# Patient Record
Sex: Female | Born: 1975
Health system: Southern US, Community
[De-identification: ages and names within clinical notes are randomized; demographics above are authoritative.]

## PROBLEM LIST (undated history)

## (undated) DIAGNOSIS — R569 Unspecified convulsions: Secondary | ICD-10-CM

## (undated) DIAGNOSIS — F4310 Post-traumatic stress disorder, unspecified: Secondary | ICD-10-CM

## (undated) DIAGNOSIS — J45909 Unspecified asthma, uncomplicated: Secondary | ICD-10-CM

## (undated) DIAGNOSIS — J449 Chronic obstructive pulmonary disease, unspecified: Secondary | ICD-10-CM

## (undated) DIAGNOSIS — F319 Bipolar disorder, unspecified: Secondary | ICD-10-CM

## (undated) DIAGNOSIS — C801 Malignant (primary) neoplasm, unspecified: Secondary | ICD-10-CM

## (undated) HISTORY — PX: CHOLECYSTECTOMY: SHX55

## (undated) HISTORY — PX: APPENDECTOMY: SHX54

## (undated) HISTORY — PX: TUBAL LIGATION: SHX77

## (undated) HISTORY — PX: TONSILLECTOMY: SUR1361

---

## 2014-10-13 ENCOUNTER — Other Ambulatory Visit (HOSPITAL_COMMUNITY): Payer: Self-pay | Admitting: Urology

## 2014-10-13 DIAGNOSIS — R310 Gross hematuria: Secondary | ICD-10-CM

## 2014-10-16 ENCOUNTER — Ambulatory Visit (HOSPITAL_COMMUNITY)
Admission: RE | Admit: 2014-10-16 | Discharge: 2014-10-16 | Disposition: A | Discharge status: Home / Self Care | Payer: Medicaid Other | Source: Ambulatory Visit | Attending: Urology | Admitting: Urology

## 2014-10-16 ENCOUNTER — Encounter (HOSPITAL_COMMUNITY): Payer: Self-pay

## 2014-10-16 DIAGNOSIS — R300 Dysuria: Secondary | ICD-10-CM | POA: Insufficient documentation

## 2014-10-16 DIAGNOSIS — J45909 Unspecified asthma, uncomplicated: Secondary | ICD-10-CM | POA: Diagnosis not present

## 2014-10-16 DIAGNOSIS — R310 Gross hematuria: Secondary | ICD-10-CM | POA: Diagnosis present

## 2014-10-16 HISTORY — DX: Malignant (primary) neoplasm, unspecified: C80.1

## 2014-10-16 HISTORY — DX: Unspecified asthma, uncomplicated: J45.909

## 2014-10-16 MED ORDER — IOHEXOL 300 MG/ML  SOLN
150.0000 mL | Freq: Once | INTRAMUSCULAR | Status: AC | PRN
  Administered 2014-10-16: 150 mL via INTRAVENOUS

## 2014-12-03 ENCOUNTER — Emergency Department (HOSPITAL_COMMUNITY): Payer: Medicaid Other

## 2014-12-03 ENCOUNTER — Emergency Department (HOSPITAL_COMMUNITY)
Admission: EM | Admit: 2014-12-03 | Discharge: 2014-12-03 | Disposition: A | Discharge status: Home / Self Care | Payer: Medicaid Other | Attending: Emergency Medicine | Admitting: Emergency Medicine

## 2014-12-03 ENCOUNTER — Encounter (HOSPITAL_COMMUNITY): Payer: Self-pay | Admitting: *Deleted

## 2014-12-03 DIAGNOSIS — S6991XA Unspecified injury of right wrist, hand and finger(s), initial encounter: Secondary | ICD-10-CM | POA: Insufficient documentation

## 2014-12-03 DIAGNOSIS — W228XXA Striking against or struck by other objects, initial encounter: Secondary | ICD-10-CM | POA: Diagnosis not present

## 2014-12-03 DIAGNOSIS — M79643 Pain in unspecified hand: Secondary | ICD-10-CM

## 2014-12-03 DIAGNOSIS — Z720 Tobacco use: Secondary | ICD-10-CM | POA: Insufficient documentation

## 2014-12-03 DIAGNOSIS — Y9389 Activity, other specified: Secondary | ICD-10-CM | POA: Diagnosis not present

## 2014-12-03 DIAGNOSIS — M79641 Pain in right hand: Secondary | ICD-10-CM

## 2014-12-03 DIAGNOSIS — Z859 Personal history of malignant neoplasm, unspecified: Secondary | ICD-10-CM | POA: Insufficient documentation

## 2014-12-03 DIAGNOSIS — J45909 Unspecified asthma, uncomplicated: Secondary | ICD-10-CM | POA: Diagnosis not present

## 2014-12-03 DIAGNOSIS — Z880 Allergy status to penicillin: Secondary | ICD-10-CM | POA: Diagnosis not present

## 2014-12-03 DIAGNOSIS — Y9289 Other specified places as the place of occurrence of the external cause: Secondary | ICD-10-CM | POA: Diagnosis not present

## 2014-12-03 DIAGNOSIS — Y998 Other external cause status: Secondary | ICD-10-CM | POA: Diagnosis not present

## 2014-12-03 MED ORDER — NAPROXEN 500 MG PO TABS: 500.0000 mg | ORAL_TABLET | Freq: Two times a day (BID) | ORAL | Status: DC

## 2014-12-03 MED ORDER — IBUPROFEN 400 MG PO TABS
800.0000 mg | ORAL_TABLET | Freq: Once | ORAL | Status: AC
  Administered 2014-12-03: 800 mg via ORAL
  Filled 2014-12-03: qty 2
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
## 2014-12-03 NOTE — Discharge Instructions (Signed)
Take the prescribed medication as directed.  May also wish to ice and elevate hand at home to help with pain/swelling. Follow-up with Dr. Caralyn Guile if symptoms worsen or no improvement in the next few days. Return to the ED for new or worsening symptoms.

## 2014-12-03 NOTE — ED Notes (Signed)
Patient transported to X-ray 
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
## 2014-12-08 ENCOUNTER — Emergency Department (HOSPITAL_COMMUNITY)
Admission: EM | Admit: 2014-12-08 | Discharge: 2014-12-08 | Disposition: A | Discharge status: Home / Self Care | Payer: Medicaid Other | Attending: Emergency Medicine | Admitting: Emergency Medicine

## 2014-12-08 DIAGNOSIS — Z859 Personal history of malignant neoplasm, unspecified: Secondary | ICD-10-CM | POA: Diagnosis not present

## 2014-12-08 DIAGNOSIS — Z79899 Other long term (current) drug therapy: Secondary | ICD-10-CM | POA: Diagnosis not present

## 2014-12-08 DIAGNOSIS — R042 Hemoptysis: Secondary | ICD-10-CM | POA: Insufficient documentation

## 2014-12-08 DIAGNOSIS — R040 Epistaxis: Secondary | ICD-10-CM | POA: Insufficient documentation

## 2014-12-08 DIAGNOSIS — Z720 Tobacco use: Secondary | ICD-10-CM | POA: Diagnosis not present

## 2014-12-08 DIAGNOSIS — Z7951 Long term (current) use of inhaled steroids: Secondary | ICD-10-CM | POA: Diagnosis not present

## 2014-12-08 DIAGNOSIS — J45909 Unspecified asthma, uncomplicated: Secondary | ICD-10-CM | POA: Insufficient documentation

## 2014-12-08 MED ORDER — SALINE SPRAY 0.65 % NA SOLN: 1.0000 | NASAL | Status: AC | PRN

## 2014-12-08 NOTE — ED Provider Notes (Signed)
CSN: [id number]     Arrival date & time 12/08/14  1056 History   First MD Initiated Contact with Patient 12/08/14 1059     Chief Complaint  Patient presents with  . Epistaxis  . Hemoptysis     (Consider location/radiation/quality/duration/timing/severity/associated sxs/prior Treatment) HPI Comments: Patient presents to the emergency department with chief complaint of epistaxis and hemoptysis. Patient states that for the past couple of days she has had intermittent nosebleeds. She also states that yesterday after the bleeding occurred she coughed up a small amount of blood. She took pictures and this showed it to me. It was indeed very small amount of blood-tinged sputum. She states that the nosebleeds are intermittent. She denies any dramatic injury. Nothing makes his symptoms better or worse. She does not take any blood thinners. There are no other symptoms at this time.  The history is provided by the patient. No language interpreter was used.    Past Medical History  Diagnosis Date  . Asthma   . Cancer    Past Surgical History  Procedure Laterality Date  . Tonsillectomy    . Appendectomy    . Cholecystectomy    . Tubal ligation    . Cesarean section     No family history on file. History  Substance Use Topics  . Smoking status: Current Every Day Smoker -- 1.00 packs/day    Types: Cigarettes  . Smokeless tobacco: Not on file  . Alcohol Use: No   OB History    No data available     Review of Systems  Constitutional: Negative for fever and chills.  HENT: Positive for nosebleeds.   Respiratory: Negative for shortness of breath.   Cardiovascular: Negative for chest pain.  Gastrointestinal: Negative for nausea, vomiting, diarrhea and constipation.  Genitourinary: Negative for dysuria.  All other systems reviewed and are negative.     Allergies  Diphenhydramine; Penicillins; and Theophyllines  Home Medications   Prior to Admission medications   Medication Sig  Start Date End Date Taking? Authorizing Provider  acetaminophen-codeine (TYLENOL #3) 300-30 MG per tablet Take 1 tablet by mouth every 4 (four) hours as needed for moderate pain.    Historical Provider, MD  albuterol (PROVENTIL HFA;VENTOLIN HFA) 108 (90 BASE) MCG/ACT inhaler Inhale 1 puff into the lungs every 4 (four) hours as needed for wheezing or shortness of breath.    Historical Provider, MD  atorvastatin (LIPITOR) 20 MG tablet Take 20 mg by mouth daily.    Historical Provider, MD  busPIRone (BUSPAR) 7.5 MG tablet Take 7.5 mg by mouth 3 (three) times daily as needed. For anxiety    Historical Provider, MD  ergocalciferol (VITAMIN D2) 50000 UNITS capsule Take 50,000 Units by mouth once a week. Take on Thursdays    Historical Provider, MD  Fluticasone-Salmeterol (ADVAIR) 250-50 MCG/DOSE AEPB Inhale 1 puff into the lungs 2 (two) times daily.    Historical Provider, MD  folic acid (FOLVITE) 1 MG tablet Take 1 mg by mouth daily.    Historical Provider, MD  HYDROcodone-acetaminophen (NORCO/VICODIN) 5-325 MG per tablet Take 1 tablet by mouth every 6 (six) hours as needed for moderate pain.    Historical Provider, MD  IRON PO Take 1 tablet by mouth daily.    Historical Provider, MD  lamoTRIgine (LAMICTAL) 25 MG tablet Take 50 mg by mouth 2 (two) times daily.    Historical Provider, MD  loratadine (CLARITIN) 10 MG tablet Take 10 mg by mouth daily.    Historical Provider, MD  naproxen (NAPROSYN) 500 MG tablet Take 1 tablet (500 mg total) by mouth 2 (two) times daily with a meal. 12/03/14   Larene Pickett, PA-C  omeprazole (PRILOSEC) 20 MG capsule Take 20 mg by mouth daily.    Historical Provider, MD  sertraline (ZOLOFT) 50 MG tablet Take 50 mg by mouth daily.    Historical Provider, MD  varenicline (CHANTIX) 1 MG tablet Take 1 mg by mouth 2 (two) times daily.    Historical Provider, MD  vitamin B-12 (CYANOCOBALAMIN) 1000 MCG tablet Take 1,000 mcg by mouth daily.    Historical Provider, MD   BP 146/87  mmHg  Pulse 96  Temp(Src) 99.4 F (37.4 C) (Oral)  Resp 18  SpO2 100%  LMP 11/26/2014 Physical Exam  Constitutional: She is oriented to person, place, and time. She appears well-developed and well-nourished.  HENT:  Head: Normocephalic and atraumatic.  Bilateral nares are patent, and there is some friable tissue in the anterior nose at [place] plexus on the right, no masses, no hematoma, airway is patent  Oropharynx is unremarkable, and there is no evidence of bleeding  Eyes: Conjunctivae and EOM are normal. Pupils are equal, round, and reactive to light.  Neck: Normal range of motion. Neck supple.  Cardiovascular: Normal rate and regular rhythm.  Exam reveals no gallop and no friction rub.   No murmur heard. Pulmonary/Chest: Effort normal and breath sounds normal. No respiratory distress. She has no wheezes. She has no rales. She exhibits no tenderness.  Abdominal: Soft. Bowel sounds are normal. She exhibits no distension and no mass. There is no tenderness. There is no rebound and no guarding.  Musculoskeletal: Normal range of motion. She exhibits no edema or tenderness.  Neurological: She is alert and oriented to person, place, and time.  Skin: Skin is warm and dry.  Psychiatric: She has a normal mood and affect. Her behavior is normal. Judgment and thought content normal.  Nursing note and vitals reviewed.   ED Course  Procedures (including critical care time) Labs Review Labs Reviewed - No data to display  Imaging Review No results found.   EKG Interpretation None      MDM   Final diagnoses:  Epistaxis    Patient with intermittent nosebleeds. Appear to be anterior. No bleeding at this time. Insignificant hemoptysis secondary to nose bleeding. No concerning gross hemoptysis, chest pain, or shortness breath. Will recommend nasal saline sprays. Nose bleeding precautions given. Patient is well-appearing. She is not in any apparent distress. Will recommend  follow-up with primary care or ENT as needed.    Montine Circle, PA-C 12/08/14 1123  Debby Freiberg, MD 12/12/14 [phone]

## 2014-12-08 NOTE — ED Notes (Signed)
Pt reports mild hemoptysis with coughing yesterday. Nose bleeding off and on since yesterday. No bleeding noted at this time. Pt NAD.

## 2014-12-08 NOTE — Discharge Instructions (Signed)

## 2014-12-29 ENCOUNTER — Encounter (HOSPITAL_COMMUNITY): Payer: Self-pay | Admitting: Emergency Medicine

## 2014-12-29 ENCOUNTER — Emergency Department (HOSPITAL_COMMUNITY)
Admission: EM | Admit: 2014-12-29 | Discharge: 2014-12-30 | Disposition: A | Discharge status: Home / Self Care | Payer: Medicaid Other | Attending: Emergency Medicine | Admitting: Emergency Medicine

## 2014-12-29 DIAGNOSIS — R1030 Lower abdominal pain, unspecified: Secondary | ICD-10-CM | POA: Diagnosis not present

## 2014-12-29 DIAGNOSIS — Z791 Long term (current) use of non-steroidal anti-inflammatories (NSAID): Secondary | ICD-10-CM | POA: Insufficient documentation

## 2014-12-29 DIAGNOSIS — Z79899 Other long term (current) drug therapy: Secondary | ICD-10-CM | POA: Diagnosis not present

## 2014-12-29 DIAGNOSIS — Z880 Allergy status to penicillin: Secondary | ICD-10-CM | POA: Insufficient documentation

## 2014-12-29 DIAGNOSIS — Z859 Personal history of malignant neoplasm, unspecified: Secondary | ICD-10-CM | POA: Diagnosis not present

## 2014-12-29 DIAGNOSIS — Z720 Tobacco use: Secondary | ICD-10-CM | POA: Diagnosis not present

## 2014-12-29 DIAGNOSIS — R109 Unspecified abdominal pain: Secondary | ICD-10-CM

## 2014-12-29 DIAGNOSIS — Z3202 Encounter for pregnancy test, result negative: Secondary | ICD-10-CM | POA: Insufficient documentation

## 2014-12-29 DIAGNOSIS — R1111 Vomiting without nausea: Secondary | ICD-10-CM | POA: Diagnosis not present

## 2014-12-29 DIAGNOSIS — J441 Chronic obstructive pulmonary disease with (acute) exacerbation: Secondary | ICD-10-CM | POA: Insufficient documentation

## 2014-12-29 DIAGNOSIS — R197 Diarrhea, unspecified: Secondary | ICD-10-CM | POA: Diagnosis present

## 2014-12-29 LAB — URINALYSIS, ROUTINE W REFLEX MICROSCOPIC
Bilirubin Urine: NEGATIVE
GLUCOSE, UA: NEGATIVE mg/dL
HGB URINE DIPSTICK: NEGATIVE
Ketones, ur: NEGATIVE mg/dL
Leukocytes, UA: NEGATIVE
NITRITE: NEGATIVE
PH: 5 (ref 5.0–8.0)
Protein, ur: NEGATIVE mg/dL
Specific Gravity, Urine: 1.021 (ref 1.005–1.030)
Urobilinogen, UA: 0.2 mg/dL (ref 0.0–1.0)

## 2014-12-29 LAB — CBC WITH DIFFERENTIAL/PLATELET
Basophils Absolute: 0 10*3/uL (ref 0.0–0.1)
Basophils Relative: 0 % (ref 0–1)
Eosinophils Absolute: 0.2 10*3/uL (ref 0.0–0.7)
Eosinophils Relative: 1 % (ref 0–5)
HEMATOCRIT: 45.6 % (ref 36.0–46.0)
Hemoglobin: 15.7 g/dL — ABNORMAL HIGH (ref 12.0–15.0)
LYMPHS PCT: 32 % (ref 12–46)
Lymphs Abs: 4.5 10*3/uL — ABNORMAL HIGH (ref 0.7–4.0)
MCH: 30.7 pg (ref 26.0–34.0)
MCHC: 34.4 g/dL (ref 30.0–36.0)
MCV: 89.1 fL (ref 78.0–100.0)
MONO ABS: 1 10*3/uL (ref 0.1–1.0)
MONOS PCT: 7 % (ref 3–12)
NEUTROS PCT: 59 % (ref 43–77)
Neutro Abs: 8.1 10*3/uL — ABNORMAL HIGH (ref 1.7–7.7)
Platelets: 313 10*3/uL (ref 150–400)
RBC: 5.12 MIL/uL — AB (ref 3.87–5.11)
RDW: 13.9 % (ref 11.5–15.5)
WBC: 12.7 10*3/uL — ABNORMAL HIGH (ref 4.0–10.5)

## 2014-12-29 LAB — COMPREHENSIVE METABOLIC PANEL
ALT: 20 U/L (ref 14–54)
ANION GAP: 12 (ref 5–15)
AST: 19 U/L (ref 15–41)
Albumin: 4.1 g/dL (ref 3.5–5.0)
Alkaline Phosphatase: 77 U/L (ref 38–126)
BUN: 10 mg/dL (ref 6–20)
CALCIUM: 9.7 mg/dL (ref 8.9–10.3)
CO2: 19 mmol/L — AB (ref 22–32)
Chloride: 107 mmol/L (ref 101–111)
Creatinine, Ser: 0.78 mg/dL (ref 0.44–1.00)
GFR calc Af Amer: >60 mL/min (ref >60)
GFR calc non Af Amer: >60 mL/min (ref >60)
Glucose, Bld: 181 mg/dL — ABNORMAL HIGH (ref 65–99)
Potassium: 3.9 mmol/L (ref 3.5–5.1)
Sodium: 138 mmol/L (ref 135–145)
TOTAL PROTEIN: 7.2 g/dL (ref 6.5–8.1)
Total Bilirubin: 0.4 mg/dL (ref 0.3–1.2)

## 2014-12-29 LAB — POC URINE PREG, ED: PREG TEST UR: NEGATIVE

## 2014-12-29 MED ORDER — PREDNISONE 20 MG PO TABS
60.0000 mg | ORAL_TABLET | Freq: Once | ORAL | Status: AC
  Administered 2014-12-30: 60 mg via ORAL
  Filled 2014-12-29: qty 3

## 2014-12-29 MED ORDER — IOHEXOL 300 MG/ML  SOLN
25.0000 mL | Freq: Once | INTRAMUSCULAR | Status: AC | PRN
  Administered 2014-12-30: 25 mL via ORAL

## 2014-12-29 MED ORDER — MAGNESIUM SULFATE 2 GM/50ML IV SOLN
2.0000 g | Freq: Once | INTRAVENOUS | Status: AC
  Administered 2014-12-30: 2 g via INTRAVENOUS
  Filled 2014-12-29: qty 50

## 2014-12-29 MED ORDER — ALBUTEROL (5 MG/ML) CONTINUOUS INHALATION SOLN
10.0000 mg/h | INHALATION_SOLUTION | RESPIRATORY_TRACT | Status: DC
  Administered 2014-12-29: 10 mg/h via RESPIRATORY_TRACT
  Filled 2014-12-29: qty 20

## 2014-12-29 MED ORDER — SODIUM CHLORIDE 0.9 % IV BOLUS (SEPSIS)
1000.0000 mL | Freq: Once | INTRAVENOUS | Status: AC
  Administered 2014-12-30: 1000 mL via INTRAVENOUS

## 2014-12-29 MED ORDER — IPRATROPIUM BROMIDE 0.02 % IN SOLN
1.0000 mg | Freq: Once | RESPIRATORY_TRACT | Status: AC
  Administered 2014-12-29: 1 mg via RESPIRATORY_TRACT
  Filled 2014-12-29: qty 5

## 2014-12-29 NOTE — ED Notes (Signed)
Pt. woke up this evening with emesis , diarrhea , generalized weakness and SOB , pt. stated she was started a new medication this afternoon Valium vaginal suppository. Respirations unlabored/ airway intact.

## 2014-12-29 NOTE — ED Provider Notes (Signed)
CSN: [id number]     Arrival date & time 12/29/14  2103 History  This chart was scribed for Everlene Balls, MD by Chester Holstein, ED Scribe. This patient was seen in room D33C/D33C and the patient's care was started at 11:29 PM.      Chief Complaint  Patient presents with  . Emesis  . Diarrhea     The history is provided by the patient. No language interpreter was used.   HPI Comments: Janet Gomez is a 39 y.o. female with PMHx of COPD, asthma, and CA who presents to the Emergency Department complaining of vomiting and diarrhea with onset this evening. Pt used a Valium suppository at 3:30 PM and took a nap and woke up with symptoms. She also reports wheezing. Pt is s/p surgery cystoscopy and hydrodistention on 12/24/14. She notes burning pain to suprapubic area.  Pt denies fever. She states she feels better now and denies nausea.  Past Medical History  Diagnosis Date  . Asthma   . Cancer    Past Surgical History  Procedure Laterality Date  . Tonsillectomy    . Appendectomy    . Cholecystectomy    . Tubal ligation    . Cesarean section     No family history on file. History  Substance Use Topics  . Smoking status: Current Every Day Smoker -- 1.00 packs/day    Types: Cigarettes  . Smokeless tobacco: Not on file  . Alcohol Use: No   OB History    No data available     Review of Systems  Gastrointestinal: Positive for vomiting and diarrhea.  A complete 10 system review of systems was obtained and all systems are negative except as noted in the HPI and PMH.      Allergies  Levaquin; Diphenhydramine; Penicillins; and Theophyllines  Home Medications   Prior to Admission medications   Medication Sig Start Date End Date Taking? Authorizing Provider  acetaminophen-codeine (TYLENOL #3) 300-30 MG per tablet Take 1 tablet by mouth every 4 (four) hours as needed for moderate pain.   Yes Historical Provider, MD  albuterol (PROVENTIL HFA;VENTOLIN HFA) 108 (90 BASE) MCG/ACT inhaler  Inhale 1 puff into the lungs every 4 (four) hours as needed for wheezing or shortness of breath.   Yes Historical Provider, MD  atorvastatin (LIPITOR) 20 MG tablet Take 20 mg by mouth daily.   Yes Historical Provider, MD  busPIRone (BUSPAR) 7.5 MG tablet Take 7.5 mg by mouth 3 (three) times daily as needed. For anxiety   Yes Historical Provider, MD  ergocalciferol (VITAMIN D2) 50000 UNITS capsule Take 50,000 Units by mouth once a week. Take on Thursdays   Yes Historical Provider, MD  Fluticasone-Salmeterol (ADVAIR) 250-50 MCG/DOSE AEPB Inhale 1 puff into the lungs 2 (two) times daily.   Yes Historical Provider, MD  folic acid (FOLVITE) 1 MG tablet Take 1 mg by mouth daily.   Yes Historical Provider, MD  HYDROcodone-acetaminophen (NORCO/VICODIN) 5-325 MG per tablet Take 1 tablet by mouth every 6 (six) hours as needed for moderate pain.   Yes Historical Provider, MD  IRON PO Take 1 tablet by mouth daily.   Yes Historical Provider, MD  lamoTRIgine (LAMICTAL) 25 MG tablet Take 50 mg by mouth 2 (two) times daily.   Yes Historical Provider, MD  loratadine (CLARITIN) 10 MG tablet Take 10 mg by mouth daily.   Yes Historical Provider, MD  naproxen (NAPROSYN) 500 MG tablet Take 1 tablet (500 mg total) by mouth 2 (two) times daily  with a meal. 12/03/14  Yes Larene Pickett, PA-C  omeprazole (PRILOSEC) 20 MG capsule Take 20 mg by mouth daily.   Yes Historical Provider, MD  sertraline (ZOLOFT) 50 MG tablet Take 50 mg by mouth daily.   Yes Historical Provider, MD  sodium chloride (OCEAN) 0.65 % SOLN nasal spray Place 1 spray into both nostrils as needed for congestion. 12/08/14  Yes Montine Circle, PA-C  vitamin B-12 (CYANOCOBALAMIN) 1000 MCG tablet Take 1,000 mcg by mouth daily.   Yes Historical Provider, MD   BP 121/76 mmHg  Pulse 112  Temp(Src) 99.7 F (37.6 C) (Oral)  Resp 18  SpO2 96%  LMP 12/22/2014 Physical Exam  Constitutional: She is oriented to person, place, and time. She appears well-developed  and well-nourished. No distress.  HENT:  Head: Normocephalic and atraumatic.  Nose: Nose normal.  Mouth/Throat: Oropharynx is clear and moist. No oropharyngeal exudate.  Eyes: Conjunctivae and EOM are normal. Pupils are equal, round, and reactive to light. No scleral icterus.  Neck: Normal range of motion. Neck supple. No JVD present. No tracheal deviation present. No thyromegaly present.  Cardiovascular: Normal rate, regular rhythm and normal heart sounds.  Exam reveals no gallop and no friction rub.   No murmur heard. Pulmonary/Chest: Effort normal. No respiratory distress. She has wheezes (bilaterally). She exhibits no tenderness.  Abdominal: Soft. Bowel sounds are normal. She exhibits no distension and no mass. There is tenderness in the suprapubic area. There is no rebound and no guarding.  Musculoskeletal: Normal range of motion. She exhibits no edema or tenderness.  Lymphadenopathy:    She has no cervical adenopathy.  Neurological: She is alert and oriented to person, place, and time. No cranial nerve deficit. She exhibits normal muscle tone.  Skin: Skin is warm and dry. No rash noted. No erythema. No pallor.  Nursing note and vitals reviewed.   ED Course  Procedures (including critical care time) DIAGNOSTIC STUDIES: Oxygen Saturation is 96% on room air, normal by my interpretation.    COORDINATION OF CARE: 11:33 PM Discussed treatment plan with patient at beside, the patient agrees with the plan and has no further questions at this time.   Labs Review Labs Reviewed  CBC WITH DIFFERENTIAL/PLATELET - Abnormal; Notable for the following:    WBC 12.7 (*)    RBC 5.12 (*)    Hemoglobin 15.7 (*)    Neutro Abs 8.1 (*)    Lymphs Abs 4.5 (*)    All other components within normal limits  COMPREHENSIVE METABOLIC PANEL - Abnormal; Notable for the following:    CO2 19 (*)    Glucose, Bld 181 (*)    All other components within normal limits  URINALYSIS, ROUTINE W REFLEX MICROSCOPIC  (NOT AT [facility]) - Abnormal; Notable for the following:    Color, Urine GREEN (*)    All other components within normal limits  POC URINE PREG, ED    Imaging Review Dg Chest 2 View  12/30/2014   CLINICAL DATA:  Wheezing.  EXAM: CHEST  2 VIEW  COMPARISON:  None.  FINDINGS: The heart size and mediastinal contours are within normal limits. Both lungs are clear. The visualized skeletal structures are unremarkable.  IMPRESSION: No active cardiopulmonary disease.   Electronically Signed   By: Rolla Flatten M.D.   On: 12/30/2014 02:08   Ct Abdomen Pelvis W Contrast  12/30/2014   CLINICAL DATA:  New onset of vomiting and diarrhea.  EXAM: CT ABDOMEN AND PELVIS WITH CONTRAST  TECHNIQUE: Multidetector  CT imaging of the abdomen and pelvis was performed using the standard protocol following bolus administration of intravenous contrast.  CONTRAST:  133mL OMNIPAQUE IOHEXOL 300 MG/ML  SOLN  COMPARISON:  09/26/2014  FINDINGS: No acute pleural parenchymal abnormality at the lung bases.  Postcholecystectomy without biliary dilatation. The liver, spleen, pancreas, and adrenal glands are normal. The kidneys demonstrate symmetric enhancement without hydronephrosis or localizing renal abnormality.  The stomach is physiologically distended. There are no dilated or thickened bowel loops. Surgical clips at the base of the cecum likely from appendectomy. Small volume of colonic stool without colonic wall thickening. No free air, free fluid, or intra-abdominal fluid collection.  No retroperitoneal adenopathy. Abdominal aorta is normal in caliber.  Within the pelvis the urinary bladder is physiologically distended. There is a probable 2.0 cm corpus luteal cyst in the left ovary which is normal in size. Right ovary appears normal. The uterus is normal. Trace free fluid in the left adnexa.  There are no acute or suspicious osseous abnormalities.  IMPRESSION: 1. Probable corpus luteal cyst in the left ovary, which is physiologic in a patient  of this age. 2. Otherwise no acute abnormality.   Electronically Signed   By: Jeb Levering M.D.   On: 12/30/2014 01:50     EKG Interpretation None      MDM   Final diagnoses:  None   Patient presents emergency department for vomiting and diarrhea after using her vaginal volumes suppository. This in the setting of recent surgery for urethral stricture. Will check rectal temperature to evaluate for postop infection. She also has wheezing on exam. She is with her breathing treatments, prednisone, magnesium for treatment. Also obtain chest x-ray and CT scan abdomen.  Imaging is negative. Patient is afebrile. Upon repeat evaluation her wheezing has resolved and she symptomatically feels better. She is not tachycardic likely due to the albuterol. She is advised to stop vaginal Valium if this is causing her nausea and vomiting. Follow-up was advised within 3 days. She appears well in no acute distress. Her vital signs were within her normal limits and she is safe for discharge.  I personally performed the services described in this documentation, which was scribed in my presence. The recorded information has been reviewed and is accurate.   Everlene Balls, MD 12/30/14 [phone]

## 2014-12-30 ENCOUNTER — Encounter (HOSPITAL_COMMUNITY): Payer: Self-pay | Admitting: Radiology

## 2014-12-30 ENCOUNTER — Emergency Department (HOSPITAL_COMMUNITY): Payer: Medicaid Other

## 2014-12-30 MED ORDER — PREDNISONE 20 MG PO TABS: 60.0000 mg | ORAL_TABLET | Freq: Every day | ORAL | Status: DC

## 2014-12-30 MED ORDER — IOHEXOL 300 MG/ML  SOLN
100.0000 mL | Freq: Once | INTRAMUSCULAR | Status: AC | PRN
  Administered 2014-12-30: 100 mL via INTRAVENOUS

## 2014-12-30 NOTE — Discharge Instructions (Signed)
Abdominal Pain, Women [name], see your surgeon within 3 days for close follow-up. Take prednisone for 5 days to treat or wheezing. Come back to the emergency department for any worsening. Thank you. Abdominal (stomach, pelvic, or belly) pain can be caused by many things. It is important to tell your doctor:  The location of the pain.  Does it come and go or is it present all the time?  Are there things that start the pain (eating certain foods, exercise)?  Are there other symptoms associated with the pain (fever, nausea, vomiting, diarrhea)? All of this is helpful to know when trying to find the cause of the pain. CAUSES   Stomach: virus or bacteria infection, or ulcer.  Intestine: appendicitis (inflamed appendix), regional ileitis (Crohn's disease), ulcerative colitis (inflamed colon), irritable bowel syndrome, diverticulitis (inflamed diverticulum of the colon), or cancer of the stomach or intestine.  Gallbladder disease or stones in the gallbladder.  Kidney disease, kidney stones, or infection.  Pancreas infection or cancer.  Fibromyalgia (pain disorder).  Diseases of the female organs:  Uterus: fibroid (non-cancerous) tumors or infection.  Fallopian tubes: infection or tubal pregnancy.  Ovary: cysts or tumors.  Pelvic adhesions (scar tissue).  Endometriosis (uterus lining tissue growing in the pelvis and on the pelvic organs).  Pelvic congestion syndrome (female organs filling up with blood just before the menstrual period).  Pain with the menstrual period.  Pain with ovulation (producing an egg).  Pain with an IUD (intrauterine device, birth control) in the uterus.  Cancer of the female organs.  Functional pain (pain not caused by a disease, may improve without treatment).  Psychological pain.  Depression. DIAGNOSIS  Your doctor will decide the seriousness of your pain by doing an examination.  Blood tests.  X-rays.  Ultrasound.  CT scan  (computed tomography, special type of X-ray).  MRI (magnetic resonance imaging).  Cultures, for infection.  Barium enema (dye inserted in the large intestine, to better view it with X-rays).  Colonoscopy (looking in intestine with a lighted tube).  Laparoscopy (minor surgery, looking in abdomen with a lighted tube).  Major abdominal exploratory surgery (looking in abdomen with a large incision). TREATMENT  The treatment will depend on the cause of the pain.   Many cases can be observed and treated at home.  Over-the-counter medicines recommended by your caregiver.  Prescription medicine.  Antibiotics, for infection.  Birth control pills, for painful periods or for ovulation pain.  Hormone treatment, for endometriosis.  Nerve blocking injections.  Physical therapy.  Antidepressants.  Counseling with a psychologist or psychiatrist.  Minor or major surgery. HOME CARE INSTRUCTIONS   Do not take laxatives, unless directed by your caregiver.  Take over-the-counter pain medicine only if ordered by your caregiver. Do not take aspirin because it can cause an upset stomach or bleeding.  Try a clear liquid diet (broth or water) as ordered by your caregiver. Slowly move to a bland diet, as tolerated, if the pain is related to the stomach or intestine.  Have a thermometer and take your temperature several times a day, and record it.  Bed rest and sleep, if it helps the pain.  Avoid sexual intercourse, if it causes pain.  Avoid stressful situations.  Keep your follow-up appointments and tests, as your caregiver orders.  If the pain does not go away with medicine or surgery, you may try:  Acupuncture.  Relaxation exercises (yoga, meditation).  Group therapy.  Counseling. SEEK MEDICAL CARE IF:   You notice certain  foods cause stomach pain.  Your home care treatment is not helping your pain.  You need stronger pain medicine.  You want your IUD removed.  You  feel faint or lightheaded.  You develop nausea and vomiting.  You develop a rash.  You are having side effects or an allergy to your medicine. SEEK IMMEDIATE MEDICAL CARE IF:   Your pain does not go away or gets worse.  You have a fever.  Your pain is felt only in portions of the abdomen. The right side could possibly be appendicitis. The left lower portion of the abdomen could be colitis or diverticulitis.  You are passing blood in your stools (bright red or black tarry stools, with or without vomiting).  You have blood in your urine.  You develop chills, with or without a fever.  You pass out. MAKE SURE YOU:   Understand these instructions.  Will watch your condition.  Will get help right away if you are not doing well or get worse. Document Released: 05/08/2007 Document Revised: 11/25/2013 Document Reviewed: 05/28/2009 [facility] Patient Information 2015 [place], [place]. This information is not intended to replace advice given to you by your health care provider. Make sure you discuss any questions you have with your health care provider. Bronchospasm A bronchospasm is when the tubes that carry air in and out of your lungs (airways) spasm or tighten. During a bronchospasm it is hard to breathe. This is because the airways get smaller. A bronchospasm can be triggered by:  Allergies. These may be to animals, pollen, food, or mold.  Infection. This is a common cause of bronchospasm.  Exercise.  Irritants. These include pollution, cigarette smoke, strong odors, aerosol sprays, and paint fumes.  Weather changes.  Stress.  Being emotional. HOME CARE   Always have a plan for getting help. Know when to call your doctor and local emergency services (911 in the [place] Know where you can get emergency care.  Only take medicines as told by your doctor.  If you were prescribed an inhaler or nebulizer machine, ask your doctor how to use it correctly. Always use a spacer with  your inhaler if you were given one.  Stay calm during an attack. Try to relax and breathe more slowly.  Control your home environment:  Change your heating and air conditioning filter at least once a month.  Limit your use of fireplaces and wood stoves.  Do not  smoke. Do not  allow smoking in your home.  Avoid perfumes and fragrances.  Get rid of pests (such as roaches and mice) and their droppings.  Throw away plants if you see mold on them.  Keep your house clean and dust free.  Replace carpet with wood, tile, or vinyl flooring. Carpet can trap dander and dust.  Use allergy-proof pillows, mattress covers, and box spring covers.  Wash bed sheets and blankets every week in hot water. Dry them in a dryer.  Use blankets that are made of polyester or cotton.  Wash hands frequently. GET HELP IF:  You have muscle aches.  You have chest pain.  The thick spit you spit or cough up (sputum) changes from clear or white to yellow, green, gray, or bloody.  The thick spit you spit or cough up gets thicker.  There are problems that may be related to the medicine you are given such as:  A rash.  Itching.  Swelling.  Trouble breathing. GET HELP RIGHT AWAY IF:  You feel you cannot breathe  or catch your breath.  You cannot stop coughing.  Your treatment is not helping you breathe better.  You have very bad chest pain. MAKE SURE YOU:   Understand these instructions.  Will watch your condition.  Will get help right away if you are not doing well or get worse. Document Released: 05/08/2009 Document Revised: 07/16/2013 Document Reviewed: 01/01/2013 [facility] Patient Information 2015 [place], [place]. This information is not intended to replace advice given to you by your health care provider. Make sure you discuss any questions you have with your health care provider.

## 2014-12-30 NOTE — ED Notes (Signed)
Patient discharged reviewed instructions and prescription voiced understanding

## 2014-12-30 NOTE — ED Notes (Addendum)
Pt rectal temp was 99.3

## 2015-04-15 ENCOUNTER — Emergency Department (HOSPITAL_COMMUNITY)
Admission: EM | Admit: 2015-04-15 | Discharge: 2015-04-15 | Disposition: A | Discharge status: Home / Self Care | Payer: Medicaid Other | Attending: Emergency Medicine | Admitting: Emergency Medicine

## 2015-04-15 ENCOUNTER — Encounter (HOSPITAL_COMMUNITY): Payer: Self-pay | Admitting: Family Medicine

## 2015-04-15 DIAGNOSIS — Z720 Tobacco use: Secondary | ICD-10-CM | POA: Insufficient documentation

## 2015-04-15 DIAGNOSIS — F319 Bipolar disorder, unspecified: Secondary | ICD-10-CM | POA: Diagnosis not present

## 2015-04-15 DIAGNOSIS — Z859 Personal history of malignant neoplasm, unspecified: Secondary | ICD-10-CM | POA: Diagnosis not present

## 2015-04-15 DIAGNOSIS — Z79899 Other long term (current) drug therapy: Secondary | ICD-10-CM | POA: Diagnosis not present

## 2015-04-15 DIAGNOSIS — N764 Abscess of vulva: Secondary | ICD-10-CM | POA: Diagnosis not present

## 2015-04-15 DIAGNOSIS — J449 Chronic obstructive pulmonary disease, unspecified: Secondary | ICD-10-CM | POA: Diagnosis not present

## 2015-04-15 DIAGNOSIS — R6883 Chills (without fever): Secondary | ICD-10-CM | POA: Insufficient documentation

## 2015-04-15 DIAGNOSIS — F4310 Post-traumatic stress disorder, unspecified: Secondary | ICD-10-CM | POA: Insufficient documentation

## 2015-04-15 DIAGNOSIS — G40909 Epilepsy, unspecified, not intractable, without status epilepticus: Secondary | ICD-10-CM | POA: Insufficient documentation

## 2015-04-15 DIAGNOSIS — Z880 Allergy status to penicillin: Secondary | ICD-10-CM | POA: Insufficient documentation

## 2015-04-15 HISTORY — DX: Bipolar disorder, unspecified: F31.9

## 2015-04-15 HISTORY — DX: Unspecified convulsions: R56.9

## 2015-04-15 HISTORY — DX: Post-traumatic stress disorder, unspecified: F43.10

## 2015-04-15 HISTORY — DX: Chronic obstructive pulmonary disease, unspecified: J44.9

## 2015-04-15 MED ORDER — LIDOCAINE HCL (PF) 1 % IJ SOLN
2.0000 mL | Freq: Once | INTRAMUSCULAR | Status: AC
  Administered 2015-04-15: 2 mL
  Filled 2015-04-15: qty 5

## 2015-04-15 MED ORDER — NAPROXEN 500 MG PO TABS: 500.0000 mg | ORAL_TABLET | Freq: Two times a day (BID) | ORAL | Status: DC

## 2015-04-15 MED ORDER — SULFAMETHOXAZOLE-TRIMETHOPRIM 800-160 MG PO TABS: 1.0000 | ORAL_TABLET | Freq: Two times a day (BID) | ORAL | Status: AC

## 2015-04-15 NOTE — ED Notes (Signed)
Pt here for abscess to vaginal area. sts x 2 days. Denies drainage but hx of same and I&D.

## 2015-04-15 NOTE — ED Provider Notes (Signed)
CSN: [id number]     Arrival date & time 04/15/15  1216 History   First MD Initiated Contact with Patient 04/15/15 1608     Chief Complaint  Patient presents with  . Abscess     (Consider location/radiation/quality/duration/timing/severity/associated sxs/prior Treatment) Patient is a 39 y.o. female presenting with abscess. The history is provided by the patient.  Abscess Associated symptoms: no fever, no headaches, no nausea and no vomiting    patient with a history of skin abscess in the external vaginal area last occurrence was 3 years ago. That's had several of these occur. Usually there and then drained. Symptoms in that same area started 2 days ago. No drainage to date associated with some chills but no fevers no vaginal discharge or vaginal bleeding. No abdominal pain.  Past Medical History  Diagnosis Date  . Asthma   . Cancer   . Seizures   . COPD (chronic obstructive pulmonary disease)   . PTSD (post-traumatic stress disorder)   . Bipolar 1 disorder    Past Surgical History  Procedure Laterality Date  . Tonsillectomy    . Appendectomy    . Cholecystectomy    . Tubal ligation    . Cesarean section     History reviewed. No pertinent family history. Social History  Substance Use Topics  . Smoking status: Current Every Day Smoker -- 1.00 packs/day    Types: Cigarettes  . Smokeless tobacco: None  . Alcohol Use: No   OB History    No data available     Review of Systems  Constitutional: Positive for chills. Negative for fever.  HENT: Negative for congestion.   Eyes: Negative for redness.  Respiratory: Negative for shortness of breath.   Cardiovascular: Negative for chest pain.  Gastrointestinal: Negative for nausea, vomiting and abdominal pain.  Genitourinary: Positive for vaginal pain. Negative for dysuria, vaginal bleeding and vaginal discharge.  Musculoskeletal: Negative for back pain.  Skin: Negative for rash and wound.  Neurological: Negative for headaches.   Hematological: Does not bruise/bleed easily.  Psychiatric/Behavioral: Negative for confusion.      Allergies  Levaquin; Diphenhydramine; Penicillins; and Theophyllines  Home Medications   Prior to Admission medications   Medication Sig Start Date End Date Taking? Authorizing Provider  albuterol (PROVENTIL HFA;VENTOLIN HFA) 108 (90 BASE) MCG/ACT inhaler Inhale 1 puff into the lungs every 4 (four) hours as needed for wheezing or shortness of breath.   Yes Historical Provider, MD  atorvastatin (LIPITOR) 20 MG tablet Take 20 mg by mouth daily.   Yes Historical Provider, MD  busPIRone (BUSPAR) 7.5 MG tablet Take 7.5 mg by mouth 3 (three) times daily as needed. For anxiety   Yes Historical Provider, MD  ergocalciferol (VITAMIN D2) 50000 UNITS capsule Take 50,000 Units by mouth once a week. Take on Thursdays   Yes Historical Provider, MD  Fluticasone-Salmeterol (ADVAIR) 250-50 MCG/DOSE AEPB Inhale 1 puff into the lungs 2 (two) times daily.   Yes Historical Provider, MD  folic acid (FOLVITE) 1 MG tablet Take 1 mg by mouth daily.   Yes Historical Provider, MD  IRON PO Take 1 tablet by mouth daily.   Yes Historical Provider, MD  lamoTRIgine (LAMICTAL) 25 MG tablet Take 50 mg by mouth 2 (two) times daily.   Yes Historical Provider, MD  loratadine (CLARITIN) 10 MG tablet Take 10 mg by mouth daily.   Yes Historical Provider, MD  omeprazole (PRILOSEC) 20 MG capsule Take 20 mg by mouth daily.   Yes Historical Provider, MD  sertraline (ZOLOFT) 50 MG tablet Take 50 mg by mouth daily.   Yes Historical Provider, MD  sodium chloride (OCEAN) 0.65 % SOLN nasal spray Place 1 spray into both nostrils as needed for congestion. 12/08/14  Yes Montine Circle, PA-C  vitamin B-12 (CYANOCOBALAMIN) 1000 MCG tablet Take 1,000 mcg by mouth daily.   Yes Historical Provider, MD  naproxen (NAPROSYN) 500 MG tablet Take 1 tablet (500 mg total) by mouth 2 (two) times daily with a meal. Patient not taking: Reported on 04/15/2015  12/03/14   Larene Pickett, PA-C  naproxen (NAPROSYN) 500 MG tablet Take 1 tablet (500 mg total) by mouth 2 (two) times daily. 04/15/15   Fredia Sorrow, MD  predniSONE (DELTASONE) 20 MG tablet Take 3 tablets (60 mg total) by mouth daily. Patient not taking: Reported on 04/15/2015 12/30/14   Everlene Balls, MD  sulfamethoxazole-trimethoprim (BACTRIM DS,SEPTRA DS) 800-160 MG per tablet Take 1 tablet by mouth 2 (two) times daily. 04/15/15 04/22/15  Fredia Sorrow, MD   BP 132/70 mmHg  Pulse 99  Temp(Src) 98.2 F (36.8 C)  Resp 18  SpO2 98%  LMP 04/05/2015 Physical Exam  Constitutional: She is oriented to person, place, and time. She appears well-developed and well-nourished. No distress.  HENT:  Head: Normocephalic and atraumatic.  Mouth/Throat: Oropharynx is clear and moist.  Eyes: Conjunctivae and EOM are normal. Pupils are equal, round, and reactive to light.  Neck: Normal range of motion. Neck supple.  Cardiovascular: Normal rate, regular rhythm and normal heart sounds.   No murmur heard. Pulmonary/Chest: Effort normal and breath sounds normal. No respiratory distress.  Abdominal: Bowel sounds are normal. She exhibits no distension.  Genitourinary:  The right labial externally along the skin with area of induration and fluctuance measuring 1 cm. Not involving the Bartholin gland. Associated with erythema and tenderness.  Musculoskeletal: Normal range of motion.  Neurological: She is alert and oriented to person, place, and time. No cranial nerve deficit. She exhibits normal muscle tone. Coordination normal.  Skin: Skin is warm.  Nursing note and vitals reviewed.   ED Course  Procedures (including critical care time) Labs Review Labs Reviewed - No data to display  Imaging Review No results found. I have personally reviewed and evaluated these images and lab results as part of my medical decision-making.   EKG Interpretation None       INCISION AND DRAINAGE Performed by:  Fredia Sorrow Consent: Verbal consent obtained. Risks and benefits: risks, benefits and alternatives were discussed Type: abscess  Body area: Right labial area not a Bartholin gland cyst.  Anesthesia: local infiltration  Incision was made with a scalpel.  Local anesthetic: lidocaine 1 % without epinephrine  Anesthetic total: 2 ml  Complexity: complex Blunt dissection to break up loculations  Drainage: purulent  Drainage amount: Small   Packing material: 1/4 in iodoform gauze  Patient tolerance: Patient tolerated the procedure well with no immediate complications.  in addition patient had a lot of the previous scarring in the area from previous I&D's. Hemostat was used to break up all the loculations. Appears to be no further significant pus pocket in the area.   MDM   Final diagnoses:  Abscess of right genital labia   Exam revealed a 1 cm right labial external abscess not involving the Bartholin's gland. Consistent with induration and fluctuance. It was incised and drained with small amount of pus coming out. Lots of scar tissue in the area which would be consistent with her history of having an  opened in the past. All loculations were broken up. Since it was only a small amount of pus that came out patient will be started on Septra. Patient will return for any new or worse symptoms. Patient will keep the drain in place for 2 days.     Fredia Sorrow, MD 04/15/15 1719

## 2015-04-15 NOTE — Discharge Instructions (Signed)
Abscess Care After An abscess (also called a boil or furuncle) is an infected area that contains a collection of pus. Signs and symptoms of an abscess include pain, tenderness, redness, or hardness, or you may feel a moveable soft area under your skin. An abscess can occur anywhere in the body. The infection may spread to surrounding tissues causing cellulitis. A cut (incision) by the surgeon was made over your abscess and the pus was drained out. Gauze may have been packed into the space to provide a drain that will allow the cavity to heal from the inside outwards. The boil may be painful for 5 to 7 days. Most people with a boil do not have high fevers. Your abscess, if seen early, may not have localized, and may not have been lanced. If not, another appointment may be required for this if it does not get better on its own or with medications. HOME CARE INSTRUCTIONS   Only take over-the-counter or prescription medicines for pain, discomfort, or fever as directed by your caregiver.  When you bathe, soak and then remove gauze or iodoform packs at least daily or as directed by your caregiver. You may then wash the wound gently with mild soapy water. Repack with gauze or do as your caregiver directs. SEEK IMMEDIATE MEDICAL CARE IF:   You develop increased pain, swelling, redness, drainage, or bleeding in the wound site.  You develop signs of generalized infection including muscle aches, chills, fever, or a general ill feeling.  An oral temperature above 102 F (38.9 C) develops, not controlled by medication. See your caregiver for a recheck if you develop any of the symptoms described above. If medications (antibiotics) were prescribed, take them as directed. Document Released: 01/27/2005 Document Revised: 10/03/2011 Document Reviewed: 09/24/2007 [facility] Patient Information 2015 [place], [place]. This information is not intended to replace advice given to you by your health care provider. Make sure  you discuss any questions you have with your health care provider.  Antibiotic as directed for the next 7 days. Leave the abscess packing in place for 2 days and you can remove it. Take the Naprosyn as needed for pain. Return for any new or worse symptoms.

## 2015-06-03 ENCOUNTER — Emergency Department (HOSPITAL_COMMUNITY)
Admission: EM | Admit: 2015-06-03 | Discharge: 2015-06-03 | Disposition: A | Discharge status: Home / Self Care | Payer: Medicaid Other | Attending: Emergency Medicine | Admitting: Emergency Medicine

## 2015-06-03 ENCOUNTER — Emergency Department (HOSPITAL_COMMUNITY): Payer: Medicaid Other

## 2015-06-03 ENCOUNTER — Encounter (HOSPITAL_COMMUNITY): Payer: Self-pay

## 2015-06-03 DIAGNOSIS — Z7952 Long term (current) use of systemic steroids: Secondary | ICD-10-CM | POA: Insufficient documentation

## 2015-06-03 DIAGNOSIS — Z859 Personal history of malignant neoplasm, unspecified: Secondary | ICD-10-CM | POA: Insufficient documentation

## 2015-06-03 DIAGNOSIS — Z79899 Other long term (current) drug therapy: Secondary | ICD-10-CM | POA: Diagnosis not present

## 2015-06-03 DIAGNOSIS — F319 Bipolar disorder, unspecified: Secondary | ICD-10-CM | POA: Diagnosis not present

## 2015-06-03 DIAGNOSIS — Z7951 Long term (current) use of inhaled steroids: Secondary | ICD-10-CM | POA: Insufficient documentation

## 2015-06-03 DIAGNOSIS — J441 Chronic obstructive pulmonary disease with (acute) exacerbation: Secondary | ICD-10-CM | POA: Insufficient documentation

## 2015-06-03 DIAGNOSIS — F4310 Post-traumatic stress disorder, unspecified: Secondary | ICD-10-CM | POA: Diagnosis not present

## 2015-06-03 DIAGNOSIS — Z720 Tobacco use: Secondary | ICD-10-CM | POA: Diagnosis not present

## 2015-06-03 DIAGNOSIS — Z880 Allergy status to penicillin: Secondary | ICD-10-CM | POA: Diagnosis not present

## 2015-06-03 DIAGNOSIS — J45901 Unspecified asthma with (acute) exacerbation: Secondary | ICD-10-CM

## 2015-06-03 DIAGNOSIS — G40909 Epilepsy, unspecified, not intractable, without status epilepticus: Secondary | ICD-10-CM | POA: Diagnosis not present

## 2015-06-03 DIAGNOSIS — R0602 Shortness of breath: Secondary | ICD-10-CM | POA: Diagnosis present

## 2015-06-03 DIAGNOSIS — Z791 Long term (current) use of non-steroidal anti-inflammatories (NSAID): Secondary | ICD-10-CM | POA: Diagnosis not present

## 2015-06-03 LAB — BASIC METABOLIC PANEL
Anion gap: 9 (ref 5–15)
BUN: 6 mg/dL (ref 6–20)
CHLORIDE: 109 mmol/L (ref 101–111)
CO2: 19 mmol/L — ABNORMAL LOW (ref 22–32)
Calcium: 9.6 mg/dL (ref 8.9–10.3)
Creatinine, Ser: 0.83 mg/dL (ref 0.44–1.00)
GFR calc Af Amer: >60 mL/min (ref >60)
GFR calc non Af Amer: >60 mL/min (ref >60)
Glucose, Bld: 130 mg/dL — ABNORMAL HIGH (ref 65–99)
Potassium: 3.8 mmol/L (ref 3.5–5.1)
SODIUM: 137 mmol/L (ref 135–145)

## 2015-06-03 LAB — CBC
HEMATOCRIT: 44.8 % (ref 36.0–46.0)
HEMOGLOBIN: 15 g/dL (ref 12.0–15.0)
MCH: 29.6 pg (ref 26.0–34.0)
MCHC: 33.5 g/dL (ref 30.0–36.0)
MCV: 88.4 fL (ref 78.0–100.0)
Platelets: 253 10*3/uL (ref 150–400)
RBC: 5.07 MIL/uL (ref 3.87–5.11)
RDW: 13.9 % (ref 11.5–15.5)
WBC: 10.4 10*3/uL (ref 4.0–10.5)

## 2015-06-03 MED ORDER — IPRATROPIUM BROMIDE 0.02 % IN SOLN
0.5000 mg | Freq: Once | RESPIRATORY_TRACT | Status: AC
  Administered 2015-06-03: 0.5 mg via RESPIRATORY_TRACT
  Filled 2015-06-03: qty 2.5

## 2015-06-03 MED ORDER — ALBUTEROL SULFATE (2.5 MG/3ML) 0.083% IN NEBU
5.0000 mg | INHALATION_SOLUTION | Freq: Once | RESPIRATORY_TRACT | Status: AC
  Administered 2015-06-03: 5 mg via RESPIRATORY_TRACT
  Filled 2015-06-03: qty 6

## 2015-06-03 MED ORDER — PREDNISONE 10 MG PO TABS: 60.0000 mg | ORAL_TABLET | Freq: Every day | ORAL | Status: DC

## 2015-06-03 MED ORDER — ALBUTEROL SULFATE (2.5 MG/3ML) 0.083% IN NEBU
10.0000 mg | INHALATION_SOLUTION | Freq: Once | RESPIRATORY_TRACT | Status: AC
  Administered 2015-06-03: 10 mg via RESPIRATORY_TRACT
  Filled 2015-06-03: qty 12

## 2015-06-03 MED ORDER — PREDNISONE 20 MG PO TABS
60.0000 mg | ORAL_TABLET | Freq: Once | ORAL | Status: AC
  Administered 2015-06-03: 60 mg via ORAL
  Filled 2015-06-03: qty 3

## 2015-06-03 NOTE — ED Notes (Signed)
Patient transported to X-ray 

## 2015-06-03 NOTE — Discharge Instructions (Signed)

## 2015-06-03 NOTE — ED Provider Notes (Signed)
CSN: [id number]     Arrival date & time 06/03/15  1800 History   First MD Initiated Contact with Patient 06/03/15 1839     Chief Complaint  Patient presents with  . Asthma      HPI Patient presents to the emergency department complaining of shortness of breath.  She has a history of asthma.  She's tried her nebulized and MDI albuterol at home.  She states no improvement.  She's had worsening symptoms of the past several days.  She was seen at the end of October and was treated in outside emergency department for an asthma exacerbation initially improved with prednisone.  She reports since then her symptoms have began to progressively worsen as well.  She does report productive cough.  No fevers or chills.  Mild shortness of breath.  Denies abdominal pain.  No other complaints.  Symptoms are mild to moderate in severity.   Past Medical History  Diagnosis Date  . Asthma   . Cancer ([place])   . Seizures ([place])   . COPD (chronic obstructive pulmonary disease) ([place])   . PTSD (post-traumatic stress disorder)   . Bipolar 1 disorder [facility])    Past Surgical History  Procedure Laterality Date  . Tonsillectomy    . Appendectomy    . Cholecystectomy    . Tubal ligation    . Cesarean section     No family history on file. Social History  Substance Use Topics  . Smoking status: Current Every Day Smoker -- 1.00 packs/day    Types: Cigarettes  . Smokeless tobacco: None  . Alcohol Use: No   OB History    No data available     Review of Systems  All other systems reviewed and are negative.     Allergies  Chicken flavor; Cyclobenzaprine; Gluten meal; Levaquin; Levofloxacin; Peanut oil; Penicillins; Shellfish allergy; Wheat extract; Diazepam; Aspirin; Diphenhydramine; and Theophyllines  Home Medications   Prior to Admission medications   Medication Sig Start Date End Date Taking? Authorizing Provider  albuterol (PROVENTIL HFA;VENTOLIN HFA) 108 (90 BASE) MCG/ACT inhaler Inhale 1 puff  into the lungs every 4 (four) hours as needed for wheezing or shortness of breath.   Yes Historical Provider, MD  albuterol (PROVENTIL) (2.5 MG/3ML) 0.083% nebulizer solution Take 2.5 mg by nebulization every 6 (six) hours as needed for wheezing or shortness of breath.   Yes Historical Provider, MD  atorvastatin (LIPITOR) 20 MG tablet Take 20 mg by mouth daily.   Yes Historical Provider, MD  busPIRone (BUSPAR) 7.5 MG tablet Take 7.5 mg by mouth 3 (three) times daily. For anxiety   Yes Historical Provider, MD  ergocalciferol (VITAMIN D2) 50000 UNITS capsule Take 50,000 Units by mouth once a week. Take on Thursdays   Yes Historical Provider, MD  Fluticasone-Salmeterol (ADVAIR) 250-50 MCG/DOSE AEPB Inhale 1 puff into the lungs 2 (two) times daily.   Yes Historical Provider, MD  folic acid (FOLVITE) 1 MG tablet Take 1 mg by mouth daily.   Yes Historical Provider, MD  IRON PO Take 1 tablet by mouth daily.   Yes Historical Provider, MD  lamoTRIgine (LAMICTAL) 25 MG tablet Take 50 mg by mouth 2 (two) times daily.   Yes Historical Provider, MD  loratadine (CLARITIN) 10 MG tablet Take 10 mg by mouth daily.   Yes Historical Provider, MD  omeprazole (PRILOSEC) 20 MG capsule Take 20 mg by mouth daily.   Yes Historical Provider, MD  sertraline (ZOLOFT) 50 MG tablet Take 50 mg by  mouth daily.   Yes Historical Provider, MD  vitamin B-12 (CYANOCOBALAMIN) 1000 MCG tablet Take 1,000 mcg by mouth daily.   Yes Historical Provider, MD  naproxen (NAPROSYN) 500 MG tablet Take 1 tablet (500 mg total) by mouth 2 (two) times daily with a meal. Patient not taking: Reported on 04/15/2015 12/03/14   Larene Pickett, PA-C  naproxen (NAPROSYN) 500 MG tablet Take 1 tablet (500 mg total) by mouth 2 (two) times daily. 04/15/15   Fredia Sorrow, MD  predniSONE (DELTASONE) 10 MG tablet Take 6 tablets (60 mg total) by mouth daily. 06/03/15   Jola Schmidt, MD  sodium chloride (OCEAN) 0.65 % SOLN nasal spray Place 1 spray into both nostrils  as needed for congestion. 12/08/14   Montine Circle, PA-C   BP 115/81 mmHg  Pulse 92  Temp(Src) 98.3 F (36.8 C) (Oral)  Resp 22  SpO2 98%  LMP 05/25/2015 Physical Exam  Constitutional: She is oriented to person, place, and time. She appears well-developed and well-nourished. No distress.  HENT:  Head: Normocephalic and atraumatic.  Eyes: EOM are normal.  Neck: Normal range of motion.  Cardiovascular: Normal rate, regular rhythm and normal heart sounds.   Pulmonary/Chest: Effort normal. No respiratory distress. She has wheezes.  Abdominal: Soft. She exhibits no distension. There is no tenderness.  Musculoskeletal: Normal range of motion.  Neurological: She is alert and oriented to person, place, and time.  Skin: Skin is warm and dry.  Psychiatric: She has a normal mood and affect. Judgment normal.  Nursing note and vitals reviewed.   ED Course  Procedures (including critical care time) Labs Review Labs Reviewed  BASIC METABOLIC PANEL - Abnormal; Notable for the following:    CO2 19 (*)    Glucose, Bld 130 (*)    All other components within normal limits  CBC    Imaging Review Dg Chest 2 View  06/03/2015  CLINICAL DATA:  Shortness of breath with hemoptysis and wheezing EXAM: CHEST  2 VIEW COMPARISON:  December 30, 2014 FINDINGS: There is no edema or consolidation. The heart size and pulmonary vascularity are normal. No adenopathy. No bone lesions. IMPRESSION: No abnormality noted. Electronically Signed   By: Lowella Grip III M.D.   On: 06/03/2015 20:23   I have personally reviewed and evaluated these images and lab results as part of my medical decision-making.   EKG Interpretation None      MDM   Final diagnoses:  Asthma exacerbation    10:29 PM Patient feels much better this time.  Patient be treated as acute asthma exacerbation.  Home with steroids.  Outpatient primary care and pulmonology follow-up.  She understands to return to the ER for new or worsening  symptoms.  Chest x-ray clear.  I recommended that she stop smoking cigarettes completely.  This will benefit her long-term health and her pulmonary function.  She will try    Jola Schmidt, MD 06/03/15 2230

## 2015-06-03 NOTE — ED Notes (Signed)
Pt stable, ambulatory, states understanding of discharge instructions 

## 2015-06-03 NOTE — ED Notes (Signed)
Upper resp. Infection on the 21st , seen  In [place] ED on the 25th and placed on  prednisoine and Zithromax.  Completed the course of medication. Not any better went to her PCP on the November 1st. And they told her that she would be better.  She is not continues to be congested and wheezing.  Nasal congestion.   Pt. Is doing Breathing treatments at home last one was 2 hours ago.  Congested cough and chest pain with coughing.

## 2015-06-05 ENCOUNTER — Emergency Department (HOSPITAL_COMMUNITY)
Admission: EM | Admit: 2015-06-05 | Discharge: 2015-06-05 | Disposition: A | Discharge status: Home / Self Care | Payer: Medicaid Other | Attending: Emergency Medicine | Admitting: Emergency Medicine

## 2015-06-05 ENCOUNTER — Encounter (HOSPITAL_COMMUNITY): Payer: Self-pay | Admitting: Emergency Medicine

## 2015-06-05 DIAGNOSIS — Z7951 Long term (current) use of inhaled steroids: Secondary | ICD-10-CM | POA: Diagnosis not present

## 2015-06-05 DIAGNOSIS — Y9289 Other specified places as the place of occurrence of the external cause: Secondary | ICD-10-CM | POA: Diagnosis not present

## 2015-06-05 DIAGNOSIS — Z880 Allergy status to penicillin: Secondary | ICD-10-CM | POA: Insufficient documentation

## 2015-06-05 DIAGNOSIS — F4310 Post-traumatic stress disorder, unspecified: Secondary | ICD-10-CM | POA: Insufficient documentation

## 2015-06-05 DIAGNOSIS — Y998 Other external cause status: Secondary | ICD-10-CM | POA: Insufficient documentation

## 2015-06-05 DIAGNOSIS — Z859 Personal history of malignant neoplasm, unspecified: Secondary | ICD-10-CM | POA: Diagnosis not present

## 2015-06-05 DIAGNOSIS — W57XXXA Bitten or stung by nonvenomous insect and other nonvenomous arthropods, initial encounter: Secondary | ICD-10-CM | POA: Diagnosis not present

## 2015-06-05 DIAGNOSIS — S50361A Insect bite (nonvenomous) of right elbow, initial encounter: Secondary | ICD-10-CM | POA: Diagnosis not present

## 2015-06-05 DIAGNOSIS — S60561A Insect bite (nonvenomous) of right hand, initial encounter: Secondary | ICD-10-CM | POA: Insufficient documentation

## 2015-06-05 DIAGNOSIS — G40909 Epilepsy, unspecified, not intractable, without status epilepticus: Secondary | ICD-10-CM | POA: Insufficient documentation

## 2015-06-05 DIAGNOSIS — Z791 Long term (current) use of non-steroidal anti-inflammatories (NSAID): Secondary | ICD-10-CM | POA: Insufficient documentation

## 2015-06-05 DIAGNOSIS — Z79899 Other long term (current) drug therapy: Secondary | ICD-10-CM | POA: Diagnosis not present

## 2015-06-05 DIAGNOSIS — F319 Bipolar disorder, unspecified: Secondary | ICD-10-CM | POA: Diagnosis not present

## 2015-06-05 DIAGNOSIS — Y9389 Activity, other specified: Secondary | ICD-10-CM | POA: Insufficient documentation

## 2015-06-05 DIAGNOSIS — J45909 Unspecified asthma, uncomplicated: Secondary | ICD-10-CM | POA: Diagnosis present

## 2015-06-05 DIAGNOSIS — J45901 Unspecified asthma with (acute) exacerbation: Secondary | ICD-10-CM

## 2015-06-05 DIAGNOSIS — J441 Chronic obstructive pulmonary disease with (acute) exacerbation: Secondary | ICD-10-CM | POA: Insufficient documentation

## 2015-06-05 DIAGNOSIS — Z720 Tobacco use: Secondary | ICD-10-CM | POA: Diagnosis not present

## 2015-06-05 MED ORDER — ALBUTEROL SULFATE (2.5 MG/3ML) 0.083% IN NEBU
5.0000 mg | INHALATION_SOLUTION | Freq: Once | RESPIRATORY_TRACT | Status: DC
  Filled 2015-06-05: qty 6

## 2015-06-05 MED ORDER — CETIRIZINE HCL 10 MG PO TABS: 10.0000 mg | ORAL_TABLET | Freq: Every day | ORAL | Status: DC

## 2015-06-05 MED ORDER — ALBUTEROL (5 MG/ML) CONTINUOUS INHALATION SOLN
10.0000 mg/h | INHALATION_SOLUTION | RESPIRATORY_TRACT | Status: AC
Start: 2015-06-05 — End: 2015-06-05
  Administered 2015-06-05: 10 mg/h via RESPIRATORY_TRACT
  Filled 2015-06-05: qty 20

## 2015-06-05 NOTE — ED Notes (Signed)
Respiratory at bedside.

## 2015-06-05 NOTE — ED Notes (Signed)
Acuity 3 per PA for continuous neb.

## 2015-06-05 NOTE — ED Notes (Signed)
Respiratory to come and start continuous neb.

## 2015-06-05 NOTE — ED Notes (Signed)
Patient states asthma exacerbation.   Patient does have inspiratory and expiratory wheezes.   Patient also complaining of insect bites on R arm.

## 2015-06-05 NOTE — Discharge Instructions (Signed)
Take the prescribed medication as directed. Continue nebulizer treatments every 4-6 hours for shortness of breath/wheezing. Continue your prednisone for the remaining days. Follow-up with the wellness clinic and pulmonology as scheduled. Return to the ED for new or worsening symptoms.

## 2015-06-05 NOTE — ED Provider Notes (Signed)
CSN: [id number]     Arrival date & time 06/05/15  1106 History  By signing my name below, I, Evelene Croon, attest that this documentation has been prepared under the direction and in the presence of non-physician practitioner, Quincy Carnes, PA-C. Electronically Signed: Evelene Croon, Scribe. 06/05/2015. 11:27 AM.    Chief Complaint  Patient presents with  . Asthma  . insect bites     The history is provided by the patient. No language interpreter was used.     HPI Comments:  Janet Gomez is a 39 y.o. female with a history of asthma and COPD, who presents to the Emergency Department complaining of intermittent bouts of wheezing since 05/15/15. She has been evaluated by her PCP and in the ED since symptoms onset. During ED visit on 06/03/15 and was given multiple neb txs and discharged with Rx for prednisone. Her last dose of prednisone was this AM 60mg . She is a current everyday smoker.  States chest tightness due to her wheezing, no other chest pain.  No hx of DVT or PE.  At this time she also complains of a "insect bites" to her right hand and right elbow. No alleviating factors noted.  States they are pruritic.  Did not directly see anything bite her.  No fever, chills, sweats.  VSS.   Past Medical History  Diagnosis Date  . Asthma   . Cancer ([place])   . Seizures ([place])   . COPD (chronic obstructive pulmonary disease) ([place])   . PTSD (post-traumatic stress disorder)   . Bipolar 1 disorder [facility])    Past Surgical History  Procedure Laterality Date  . Tonsillectomy    . Appendectomy    . Cholecystectomy    . Tubal ligation    . Cesarean section     No family history on file. Social History  Substance Use Topics  . Smoking status: Current Every Day Smoker -- 1.00 packs/day    Types: Cigarettes  . Smokeless tobacco: None  . Alcohol Use: No   OB History    No data available     Review of Systems  Constitutional: Negative for fever and chills.  Respiratory: Positive for  wheezing.   Cardiovascular: Negative for chest pain.  Skin: Positive for rash (bug bites).  All other systems reviewed and are negative.   Allergies  Chicken flavor; Cyclobenzaprine; Gluten meal; Levaquin; Levofloxacin; Peanut oil; Penicillins; Shellfish allergy; Wheat extract; Diazepam; Aspirin; Diphenhydramine; and Theophyllines  Home Medications   Prior to Admission medications   Medication Sig Start Date End Date Taking? Authorizing Provider  albuterol (PROVENTIL HFA;VENTOLIN HFA) 108 (90 BASE) MCG/ACT inhaler Inhale 1 puff into the lungs every 4 (four) hours as needed for wheezing or shortness of breath.    Historical Provider, MD  albuterol (PROVENTIL) (2.5 MG/3ML) 0.083% nebulizer solution Take 2.5 mg by nebulization every 6 (six) hours as needed for wheezing or shortness of breath.    Historical Provider, MD  atorvastatin (LIPITOR) 20 MG tablet Take 20 mg by mouth daily.    Historical Provider, MD  busPIRone (BUSPAR) 7.5 MG tablet Take 7.5 mg by mouth 3 (three) times daily. For anxiety    Historical Provider, MD  ergocalciferol (VITAMIN D2) 50000 UNITS capsule Take 50,000 Units by mouth once a week. Take on Thursdays    Historical Provider, MD  Fluticasone-Salmeterol (ADVAIR) 250-50 MCG/DOSE AEPB Inhale 1 puff into the lungs 2 (two) times daily.    Historical Provider, MD  folic acid (FOLVITE) 1 MG tablet Take  1 mg by mouth daily.    Historical Provider, MD  IRON PO Take 1 tablet by mouth daily.    Historical Provider, MD  lamoTRIgine (LAMICTAL) 25 MG tablet Take 50 mg by mouth 2 (two) times daily.    Historical Provider, MD  loratadine (CLARITIN) 10 MG tablet Take 10 mg by mouth daily.    Historical Provider, MD  naproxen (NAPROSYN) 500 MG tablet Take 1 tablet (500 mg total) by mouth 2 (two) times daily with a meal. Patient not taking: Reported on 04/15/2015 12/03/14   Larene Pickett, PA-C  naproxen (NAPROSYN) 500 MG tablet Take 1 tablet (500 mg total) by mouth 2 (two) times daily.  04/15/15   Fredia Sorrow, MD  omeprazole (PRILOSEC) 20 MG capsule Take 20 mg by mouth daily.    Historical Provider, MD  predniSONE (DELTASONE) 10 MG tablet Take 6 tablets (60 mg total) by mouth daily. 06/03/15   Jola Schmidt, MD  sertraline (ZOLOFT) 50 MG tablet Take 50 mg by mouth daily.    Historical Provider, MD  sodium chloride (OCEAN) 0.65 % SOLN nasal spray Place 1 spray into both nostrils as needed for congestion. 12/08/14   Montine Circle, PA-C  vitamin B-12 (CYANOCOBALAMIN) 1000 MCG tablet Take 1,000 mcg by mouth daily.    Historical Provider, MD   BP 132/79 mmHg  Pulse 104  Temp(Src) 98.3 F (36.8 C) (Oral)  Resp 26  SpO2 97%  LMP 05/25/2015   Physical Exam  Constitutional: She is oriented to person, place, and time. She appears well-developed and well-nourished. No distress.  HENT:  Head: Normocephalic and atraumatic.  Right Ear: Tympanic membrane and ear canal normal.  Left Ear: Tympanic membrane and ear canal normal.  Nose: Nose normal.  Mouth/Throat: Uvula is midline, oropharynx is clear and moist and mucous membranes are normal. No oropharyngeal exudate, posterior oropharyngeal edema, posterior oropharyngeal erythema or tonsillar abscesses.  Eyes: Conjunctivae and EOM are normal. Pupils are equal, round, and reactive to light.  Neck: Normal range of motion. Neck supple.  Cardiovascular: Normal rate, regular rhythm and normal heart sounds.   Pulmonary/Chest: Effort normal. No accessory muscle usage. No respiratory distress. She has no decreased breath sounds. She has wheezes. She has no rhonchi. She has no rales.  No distress, speaking in full sentences without difficulty, inspiratory and expiratory wheezes throughout  Musculoskeletal: Normal range of motion. She exhibits no edema.  Neurological: She is alert and oriented to person, place, and time.  Skin: Skin is warm and dry. She is not diaphoretic.  Bug bites noted to right lateral hand and right elbow; localized  reaction evident; no significant swelling, cellulitis, abscess formation, or warmth to touch  Psychiatric: She has a normal mood and affect.  Nursing note and vitals reviewed.   ED Course  Procedures   DIAGNOSTIC STUDIES:  Oxygen Saturation is 98% on RA, normal by my interpretation.    COORDINATION OF CARE:  11:24 AM Discussed treatment plan with pt at bedside and pt agreed to plan.  Labs Review Labs Reviewed - No data to display  Imaging Review Dg Chest 2 View  06/03/2015  CLINICAL DATA:  Shortness of breath with hemoptysis and wheezing EXAM: CHEST  2 VIEW COMPARISON:  December 30, 2014 FINDINGS: There is no edema or consolidation. The heart size and pulmonary vascularity are normal. No adenopathy. No bone lesions. IMPRESSION: No abnormality noted. Electronically Signed   By: Lowella Grip III M.D.   On: 06/03/2015 20:23   I have  personally reviewed and evaluated these images as part of my medical decision-making.   EKG Interpretation None      MDM   Final diagnoses:  Asthma exacerbation   39 y.o. F here with wheezing.  States multiple visits for the same since 10/21 between the ED and her PCP.  States she was seen here 2 days ago, received multiple nebulizer treatments and would discharge home with prednisone taper. Chest x-ray that time was negative for acute findings. I've reviewed images and agree with the initial interpretation. Patient continues to have inspiratory and expiratory wheezes on exam. She is in no acute respiratory distress. Her vital signs are stable on room air.  Patient given hour-long albuterol treatment with significant improvement of her symptoms. She states chest tightness has resolved. Vital signs remained stable.  Respiratory symptoms likely due to her asthma/COPD/emphysema. Patient was once again encouraged to stop smoking. She will continue her prednisone, continue albuterol treatments every 4-6 hours as needed for wheezing. She was given referrals to  wellness clinic and has an appointment on 11/23 and was also given referral to pulmonology. In regards to bug bites, these appear noninfectious. She appears to have a mild localized reaction. Discussed plan with patient, he/she acknowledged understanding and agreed with plan of care.  Return precautions given for new or worsening symptoms.  I personally performed the services described in this documentation, which was scribed in my presence. The recorded information has been reviewed and is accurate.   Larene Pickett, PA-C 06/05/15 [place], MD 06/05/15 [phone]

## 2015-06-08 ENCOUNTER — Telehealth: Payer: Self-pay | Admitting: Pulmonary Disease

## 2015-06-08 NOTE — Telephone Encounter (Signed)
IMAGING CXR PA/LAT 06/03/15 (personally reviewed by me):  Low lung volumes. Slight elevation of the left hemidiaphragm that may be positional. No pleural effusion or thickening appreciated. No opacity or nodule appreciated. Heart & mediastinum normal in contour.  CTA CHEST 07/24/13 (per radiologist @ [facility]):  No PE. Reticulonodular pattern with some ground glass opacities seen in RUL with some motion artifact. No pleural effusion.  LABS 06/03/15 CBC:  10.4/15.0/44.8/253 BMP:  137/3.8/109/19/6/0.83/130/9.6

## 2015-06-09 ENCOUNTER — Institutional Professional Consult (permissible substitution): Payer: Medicaid Other | Admitting: Pulmonary Disease

## 2015-06-17 ENCOUNTER — Encounter (HOSPITAL_BASED_OUTPATIENT_CLINIC_OR_DEPARTMENT_OTHER): Payer: Medicaid Other | Admitting: Clinical

## 2015-06-17 ENCOUNTER — Encounter: Payer: Self-pay | Admitting: Family Medicine

## 2015-06-17 ENCOUNTER — Ambulatory Visit: Payer: Medicaid Other | Attending: Family Medicine | Admitting: Family Medicine

## 2015-06-17 VITALS — BP 121/74 | HR 95 | Temp 97.7°F | Resp 16 | Ht 65.5 in | Wt 206.0 lb

## 2015-06-17 DIAGNOSIS — J4540 Moderate persistent asthma, uncomplicated: Secondary | ICD-10-CM

## 2015-06-17 DIAGNOSIS — T148 Other injury of unspecified body region: Secondary | ICD-10-CM

## 2015-06-17 DIAGNOSIS — R569 Unspecified convulsions: Secondary | ICD-10-CM | POA: Insufficient documentation

## 2015-06-17 DIAGNOSIS — Z886 Allergy status to analgesic agent status: Secondary | ICD-10-CM | POA: Insufficient documentation

## 2015-06-17 DIAGNOSIS — Z853 Personal history of malignant neoplasm of breast: Secondary | ICD-10-CM

## 2015-06-17 DIAGNOSIS — Z880 Allergy status to penicillin: Secondary | ICD-10-CM | POA: Diagnosis not present

## 2015-06-17 DIAGNOSIS — J438 Other emphysema: Secondary | ICD-10-CM

## 2015-06-17 DIAGNOSIS — R21 Rash and other nonspecific skin eruption: Secondary | ICD-10-CM | POA: Insufficient documentation

## 2015-06-17 DIAGNOSIS — F4310 Post-traumatic stress disorder, unspecified: Secondary | ICD-10-CM | POA: Diagnosis not present

## 2015-06-17 DIAGNOSIS — J45909 Unspecified asthma, uncomplicated: Secondary | ICD-10-CM | POA: Insufficient documentation

## 2015-06-17 DIAGNOSIS — M419 Scoliosis, unspecified: Secondary | ICD-10-CM | POA: Insufficient documentation

## 2015-06-17 DIAGNOSIS — M545 Low back pain: Secondary | ICD-10-CM | POA: Diagnosis not present

## 2015-06-17 DIAGNOSIS — Z881 Allergy status to other antibiotic agents status: Secondary | ICD-10-CM | POA: Diagnosis not present

## 2015-06-17 DIAGNOSIS — X58XXXA Exposure to other specified factors, initial encounter: Secondary | ICD-10-CM | POA: Insufficient documentation

## 2015-06-17 DIAGNOSIS — Z79899 Other long term (current) drug therapy: Secondary | ICD-10-CM | POA: Insufficient documentation

## 2015-06-17 DIAGNOSIS — Z9102 Food additives allergy status: Secondary | ICD-10-CM | POA: Diagnosis not present

## 2015-06-17 DIAGNOSIS — J441 Chronic obstructive pulmonary disease with (acute) exacerbation: Secondary | ICD-10-CM | POA: Diagnosis not present

## 2015-06-17 DIAGNOSIS — Z91013 Allergy to seafood: Secondary | ICD-10-CM | POA: Diagnosis not present

## 2015-06-17 DIAGNOSIS — Z888 Allergy status to other drugs, medicaments and biological substances status: Secondary | ICD-10-CM | POA: Insufficient documentation

## 2015-06-17 DIAGNOSIS — F17210 Nicotine dependence, cigarettes, uncomplicated: Secondary | ICD-10-CM | POA: Insufficient documentation

## 2015-06-17 DIAGNOSIS — M4120 Other idiopathic scoliosis, site unspecified: Secondary | ICD-10-CM | POA: Insufficient documentation

## 2015-06-17 DIAGNOSIS — W57XXXA Bitten or stung by nonvenomous insect and other nonvenomous arthropods, initial encounter: Secondary | ICD-10-CM | POA: Diagnosis not present

## 2015-06-17 DIAGNOSIS — J449 Chronic obstructive pulmonary disease, unspecified: Secondary | ICD-10-CM | POA: Insufficient documentation

## 2015-06-17 DIAGNOSIS — J45901 Unspecified asthma with (acute) exacerbation: Secondary | ICD-10-CM | POA: Diagnosis not present

## 2015-06-17 DIAGNOSIS — F319 Bipolar disorder, unspecified: Secondary | ICD-10-CM

## 2015-06-17 DIAGNOSIS — Z659 Problem related to unspecified psychosocial circumstances: Secondary | ICD-10-CM

## 2015-06-17 DIAGNOSIS — F17200 Nicotine dependence, unspecified, uncomplicated: Secondary | ICD-10-CM

## 2015-06-17 MED ORDER — NAPROXEN 500 MG PO TABS: 500.0000 mg | ORAL_TABLET | Freq: Two times a day (BID) | ORAL | Status: DC

## 2015-06-17 MED ORDER — HYDROCORTISONE 1 % EX CREA: 1.0000 "application " | TOPICAL_CREAM | Freq: Two times a day (BID) | CUTANEOUS | Status: DC

## 2015-06-17 NOTE — Progress Notes (Signed)
ASSESSMENT: Pt currently experiencing problem related to psychosocial circumstances. Pt needs to f/u with PCP and [facility], and establish [place] med management with psychiatry; would benefit from psychoeducation and supportive counseling regarding coping with psychosocial circumstances.  Stage of Change: contemplative  PLAN: 1. F/U with behavioral health consultant in one month 2. Psychiatric Medications: Zoloft, Buspar 3. Behavioral recommendation(s):   -Go to [employer] to establish [facility] med management -Consider applying for Medicaid transportation, as needed -Consider reading educational material regarding coping with symptoms of anxiety and depression, as needed -Consider community resources, as given in office visit  SUBJECTIVE: Pt. referred by Dr Jarold Song for psych referral for [facility] med management:  Pt. reports the following symptoms/concerns: Pt states that her primary concern is life stressers; currently, her grandmother has been placed in hospice; she states that she is starting to feel some depression, and that she worries a lot about life stressers. When she was on her [place] meds (outside of [place]), she was doing well, and wants to establish care for [facility] meds again.  Duration of problem: about 6 months Severity: mild  OBJECTIVE: Orientation & Cognition: Oriented x3. Thought processes normal and appropriate to situation. Mood: appropriate. Affect: appropriate Appearance: appropriate Risk of harm to self or others: no known risk of harm to self or others Substance use: tobacco Assessments administered: PHQ2: 0 (admitted some symptoms later, during consultation)  Diagnosis: Problem related to psychosocial stressers CPT Code: Z65.9 -------------------------------------------- Other(s) present in the room: husband  Time spent with patient in exam room: 16 minutes

## 2015-06-17 NOTE — Progress Notes (Signed)
CC: ED follow up for Asthma exacerbation  HPI: Janet Gomez is a 39 y.o. female here today for a follow up visit.  Patient has past medical history of Asthma, Seizures, Bipolar Disorder, chronic low back pain, ? Breast cancer who relocated from [facility] to [place] and was recently seen at he ED at [facility] for Asthma exacerbation.  CXR were negative for acute findings and she was placed on nebulizer treatments and solumedrol. Symptoms were thought to be due to Asthma?COPD exacerbations. She was placed on nebulizer treatments and Prednisone as well as her regular medications prior to discharge.  Today she complains of a pruritic rash on her left knee which she noticed a few days ago and has had a few other pruritic lesions on other parts of her body. Also complains of low back pain from Scoliosis and is requesting a refill of her Naproxen. She informs she she has a history ob right breast cancer diagnosed in 2008 and never had surgery or radiation and "just took pills" Patient has No headache, No chest pain, No abdominal pain - No Nausea, No new weakness tingling or numbness, No Cough - SOB.  Allergies  Allergen Reactions  . Chicken Flavor Anaphylaxis  . Cyclobenzaprine Anaphylaxis  . Gluten Meal Anaphylaxis  . Levaquin [Levofloxacin In D5w] Hives and Shortness Of Breath  . Levofloxacin Anaphylaxis and Other (See Comments)    Other Reaction: THROAT CLOSES  . Peanut Oil Anaphylaxis  . Penicillins Hives and Shortness Of Breath    All cross reactors also   . Shellfish Allergy Anaphylaxis  . Wheat Extract Anaphylaxis  . Diazepam Swelling  . Aspirin Other (See Comments)  . Diphenhydramine Hives  . Theophyllines Hives   Past Medical History  Diagnosis Date  . Asthma   . Cancer ([place])   . Seizures ([place])   . COPD (chronic obstructive pulmonary disease) ([place])   . PTSD (post-traumatic stress disorder)   . Bipolar 1 disorder [facility])    Current Outpatient Prescriptions  on File Prior to Visit  Medication Sig Dispense Refill  . albuterol (PROVENTIL HFA;VENTOLIN HFA) 108 (90 BASE) MCG/ACT inhaler Inhale 1 puff into the lungs every 4 (four) hours as needed for wheezing or shortness of breath.    Marland Kitchen albuterol (PROVENTIL) (2.5 MG/3ML) 0.083% nebulizer solution Take 2.5 mg by nebulization every 6 (six) hours as needed for wheezing or shortness of breath.    Marland Kitchen atorvastatin (LIPITOR) 20 MG tablet Take 20 mg by mouth daily.    . busPIRone (BUSPAR) 7.5 MG tablet Take 7.5 mg by mouth 3 (three) times daily. For anxiety    . cetirizine (ZYRTEC ALLERGY) 10 MG tablet Take 1 tablet (10 mg total) by mouth daily. 30 tablet 0  . ergocalciferol (VITAMIN D2) 50000 UNITS capsule Take 50,000 Units by mouth once a week. Take on Thursdays    . Fluticasone-Salmeterol (ADVAIR) 250-50 MCG/DOSE AEPB Inhale 1 puff into the lungs 2 (two) times daily.    . folic acid (FOLVITE) 1 MG tablet Take 1 mg by mouth daily.    . IRON PO Take 1 tablet by mouth daily.    Marland Kitchen lamoTRIgine (LAMICTAL) 25 MG tablet Take 50 mg by mouth 2 (two) times daily.    Marland Kitchen loratadine (CLARITIN) 10 MG tablet Take 10 mg by mouth daily.    Marland Kitchen omeprazole (PRILOSEC) 20 MG capsule Take 20 mg by mouth daily.    . sertraline (ZOLOFT) 50 MG tablet Take 50 mg by mouth daily.    Marland Kitchen  sodium chloride (OCEAN) 0.65 % SOLN nasal spray Place 1 spray into both nostrils as needed for congestion. 1 Bottle 0  . vitamin B-12 (CYANOCOBALAMIN) 1000 MCG tablet Take 1,000 mcg by mouth daily.     No current facility-administered medications on file prior to visit.   History reviewed. No pertinent family history. Social History   Social History  . Marital Status: Married    Spouse Name: N/A  . Number of Children: N/A  . Years of Education: N/A   Occupational History  . Not on file.   Social History Main Topics  . Smoking status: Current Every Day Smoker -- 1.00 packs/day    Types: Cigarettes  . Smokeless tobacco: Not on file  . Alcohol Use:  No  . Drug Use: No  . Sexual Activity: Not on file   Other Topics Concern  . Not on file   Social History Narrative    Review of Systems: Constitutional: Negative for fever, chills, diaphoresis, activity change, appetite change and fatigue. HENT: Negative for ear pain, nosebleeds, congestion, facial swelling, rhinorrhea, neck pain, neck stiffness and ear discharge.  Eyes: Negative for pain, discharge, redness, itching and visual disturbance. Respiratory: Negative for cough, choking, chest tightness, shortness of breath, wheezing and stridor.  Cardiovascular: Negative for chest pain, palpitations and leg swelling. Gastrointestinal: Negative for abdominal distention. Genitourinary: Negative for dysuria, urgency, frequency, hematuria, flank pain, decreased urine volume, difficulty urinating and dyspareunia.  Musculoskeletal: Negative for back pain, joint swelling, arthralgias and gait problem. Neurological: Negative for dizziness, tremors, seizures, syncope, facial asymmetry, speech difficulty, weakness, light-headedness, numbness and headaches.  Hematological: Negative for adenopathy. Does not bruise/bleed easily. Psychiatric/Behavioral: Negative for hallucinations, behavioral problems, confusion, dysphoric mood, decreased concentration and agitation.    Objective:   Filed Vitals:   06/17/15 1405  BP: 121/74  Pulse: 95  Temp: 97.7 F (36.5 C)  Resp: 16    Physical Exam: Constitutional: Patient appears well-developed and well-nourished. No distress. HENT: Normocephalic, atraumatic, External right and left ear normal. Oropharynx is clear and moist.  Eyes: Conjunctivae and EOM are normal. PERRLA, no scleral icterus. Neck: Normal ROM. Neck supple. No JVD. No tracheal deviation. No thyromegaly. CVS: RRR, S1/S2 +, no murmurs, no gallops, no carotid bruit.  Pulmonary: Effort and breath sounds normal, no stridor, rhonchi, wheezes, rales.  Abdominal: Soft. BS +,  no distension,  tenderness, rebound or guarding.  Musculoskeletal: Normal range of motion. No edema and no tenderness.  Lymphadenopathy: No lymphadenopathy noted, cervical, inguinal or axillary Neuro: Alert. Normal reflexes, muscle tone coordination. No cranial nerve deficit. Skin: right knee with erythematous induration from bug bite. Psychiatric: Normal mood and affect. Behavior, judgment, thought content normal.  Lab Results  Component Value Date   WBC 10.4 06/03/2015   HGB 15.0 06/03/2015   HCT 44.8 06/03/2015   MCV 88.4 06/03/2015   PLT 253 06/03/2015   Lab Results  Component Value Date   CREATININE 0.83 06/03/2015   BUN 6 06/03/2015   NA 137 06/03/2015   K 3.8 06/03/2015   CL 109 06/03/2015   CO2 19* 06/03/2015        Assessment and plan:  COPD and Asthma exacerbation: Resolved Continue Advair, Proventil Advised that smoking cessation will increase frequency of exacerbations  Bipolar Disorder: She remains on Lamictal, Zoloft Referral to Psych for optimization of management  Seizures: No recent seizures in the last few years. Continue   Scoliosis/Lumbago: Refilled Naproxen   Bug bite: Placed on topical Hydrocortisone  ? History of  breast ca: Will have patient sign medical release so old records can be obtained and she will need to be referred to Oncology at her next visit for close surveillance  Tobacco abuse: Spent 3 minutes discussing cessation She is not ready to quit.  Arnoldo Morale, [facility] [phone] 06/17/2015, 5:05 PM

## 2015-06-17 NOTE — Progress Notes (Signed)
Pt's here for ED f/up for asthma exacerbation. Pt reports that she was bitten by something on left knee x2days ago. Pt states there is some redness in the area, itchy and painful. Pt rates pain at level 10/10.  Pt was also Dx with Scoliosis for years. Pain rated at 8/10. Aching.  Pt requesting flu shot, but patient is allergic to egg white.

## 2015-06-17 NOTE — Patient Instructions (Signed)
Asthma, Adult Asthma is a recurring condition in which the airways tighten and narrow. Asthma can make it difficult to breathe. It can cause coughing, wheezing, and shortness of breath. Asthma episodes, also called asthma attacks, range from minor to life-threatening. Asthma cannot be cured, but medicines and lifestyle changes can help control it. CAUSES Asthma is believed to be caused by inherited (genetic) and environmental factors, but its exact cause is unknown. Asthma may be triggered by allergens, lung infections, or irritants in the air. Asthma triggers are different for each person. Common triggers include:   Animal dander.  Dust mites.  Cockroaches.  Pollen from trees or grass.  Mold.  Smoke.  Air pollutants such as dust, household cleaners, hair sprays, aerosol sprays, paint fumes, strong chemicals, or strong odors.  Cold air, weather changes, and winds (which increase molds and pollens in the air).  Strong emotional expressions such as crying or laughing hard.  Stress.  Certain medicines (such as aspirin) or types of drugs (such as beta-blockers).  Sulfites in foods and drinks. Foods and drinks that may contain sulfites include dried fruit, potato chips, and sparkling grape juice.  Infections or inflammatory conditions such as the flu, a cold, or an inflammation of the nasal membranes (rhinitis).  Gastroesophageal reflux disease (GERD).  Exercise or strenuous activity. SYMPTOMS Symptoms may occur immediately after asthma is triggered or many hours later. Symptoms include:  Wheezing.  Excessive nighttime or early morning coughing.  Frequent or severe coughing with a common cold.  Chest tightness.  Shortness of breath. DIAGNOSIS  The diagnosis of asthma is made by a review of your medical history and a physical exam. Tests may also be performed. These may include:  Lung function studies. These tests show how much air you breathe in and out.  Allergy  tests.  Imaging tests such as X-rays. TREATMENT  Asthma cannot be cured, but it can usually be controlled. Treatment involves identifying and avoiding your asthma triggers. It also involves medicines. There are 2 classes of medicine used for asthma treatment:   Controller medicines. These prevent asthma symptoms from occurring. They are usually taken every day.  Reliever or rescue medicines. These quickly relieve asthma symptoms. They are used as needed and provide short-term relief. Your health care provider will help you create an asthma action plan. An asthma action plan is a written plan for managing and treating your asthma attacks. It includes a list of your asthma triggers and how they may be avoided. It also includes information on when medicines should be taken and when their dosage should be changed. An action plan may also involve the use of a device called a peak flow meter. A peak flow meter measures how well the lungs are working. It helps you monitor your condition. HOME CARE INSTRUCTIONS   Take medicines only as directed by your health care provider. Speak with your health care provider if you have questions about how or when to take the medicines.  Use a peak flow meter as directed by your health care provider. Record and keep track of readings.  Understand and use the action plan to help minimize or stop an asthma attack without needing to seek medical care.  Control your home environment in the following ways to help prevent asthma attacks:  Do not smoke. Avoid being exposed to secondhand smoke.  Change your heating and air conditioning filter regularly.  Limit your use of fireplaces and wood stoves.  Get rid of pests (such as roaches   and mice) and their droppings.  Throw away plants if you see mold on them.  Clean your floors and dust regularly. Use unscented cleaning products.  Try to have someone else vacuum for you regularly. Stay out of rooms while they are  being vacuumed and for a short while afterward. If you vacuum, use a dust mask from a hardware store, a double-layered or microfilter vacuum cleaner bag, or a vacuum cleaner with a HEPA filter.  Replace carpet with wood, tile, or vinyl flooring. Carpet can trap dander and dust.  Use allergy-proof pillows, mattress covers, and box spring covers.  Wash bed sheets and blankets every week in hot water and dry them in a dryer.  Use blankets that are made of polyester or cotton.  Clean bathrooms and kitchens with bleach. If possible, have someone repaint the walls in these rooms with mold-resistant paint. Keep out of the rooms that are being cleaned and painted.  Wash hands frequently. SEEK MEDICAL CARE IF:   You have wheezing, shortness of breath, or a cough even if taking medicine to prevent attacks.  The colored mucus you cough up (sputum) is thicker than usual.  Your sputum changes from clear or white to yellow, green, gray, or bloody.  You have any problems that may be related to the medicines you are taking (such as a rash, itching, swelling, or trouble breathing).  You are using a reliever medicine more than 2-3 times per week.  Your peak flow is still at 50-79% of your personal best after following your action plan for 1 hour.  You have a fever. SEEK IMMEDIATE MEDICAL CARE IF:   You seem to be getting worse and are unresponsive to treatment during an asthma attack.  You are short of breath even at rest.  You get short of breath when doing very little physical activity.  You have difficulty eating, drinking, or talking due to asthma symptoms.  You develop chest pain.  You develop a fast heartbeat.  You have a bluish color to your lips or fingernails.  You are light-headed, dizzy, or faint.  Your peak flow is less than 50% of your personal best.   This information is not intended to replace advice given to you by your health care provider. Make sure you discuss any  questions you have with your health care provider.   Document Released: 07/11/2005 Document Revised: 04/01/2015 Document Reviewed: 02/07/2013 Elsevier Interactive Patient Education 2016 Else[employer] Inc.  

## 2015-06-20 DIAGNOSIS — F17210 Nicotine dependence, cigarettes, uncomplicated: Secondary | ICD-10-CM | POA: Insufficient documentation

## 2015-06-26 ENCOUNTER — Institutional Professional Consult (permissible substitution): Payer: Medicaid Other | Admitting: Internal Medicine

## 2015-07-01 ENCOUNTER — Ambulatory Visit (INDEPENDENT_AMBULATORY_CARE_PROVIDER_SITE_OTHER): Payer: Medicaid Other | Admitting: Internal Medicine

## 2015-07-01 ENCOUNTER — Encounter: Payer: Self-pay | Admitting: Internal Medicine

## 2015-07-01 VITALS — BP 150/88 | HR 124 | Ht 66.0 in | Wt 212.6 lb

## 2015-07-01 DIAGNOSIS — R0600 Dyspnea, unspecified: Secondary | ICD-10-CM | POA: Diagnosis not present

## 2015-07-01 DIAGNOSIS — Z720 Tobacco use: Secondary | ICD-10-CM

## 2015-07-01 DIAGNOSIS — J4540 Moderate persistent asthma, uncomplicated: Secondary | ICD-10-CM

## 2015-07-01 DIAGNOSIS — F17210 Nicotine dependence, cigarettes, uncomplicated: Secondary | ICD-10-CM

## 2015-07-01 LAB — NITRIC OXIDE: Nitric Oxide: 6

## 2015-07-01 MED ORDER — MOMETASONE FURO-FORMOTEROL FUM 100-5 MCG/ACT IN AERO
INHALATION_SPRAY | RESPIRATORY_TRACT | Status: DC
Start: 2015-07-01 — End: 2015-08-13

## 2015-07-01 NOTE — Progress Notes (Deleted)
   Subjective:    Patient ID: Janet Gomez, female    DOB: February 20, 1976, 39 y.o.   MRN: [id number]  HPI    Review of Systems     Objective:   Physical Exam        Assessment & Plan:

## 2015-07-01 NOTE — Patient Instructions (Addendum)
Work on inhaler technique:  relax and gently blow all the way out then take a nice smooth deep breath back in, triggering the inhaler at same time you start breathing in.  Hold for up to 5 seconds if you can. Blow out thru nose. Rinse and gargle with water when done.    Plan A = Automatic = Dulera 100 Take 2 puffs first thing in am and then another 2 puffs about 12 hours later.   Plan B = Backup -  Only use your albuterol as a rescue medication to be used if you can't catch your breath by resting or doing a relaxed purse lip breathing pattern.  - The less you use it, the better it will work when you need it. - Ok to use up to 2 puffs  every 4 hours if you must but call for immediate appointment if use goes up over your usual need - Don't leave home without it !!  (think of it like the spare tire for your car)  Plan C = Crisis - albuterol 2.5mg  every 4 hours if you try the inhaler first and it doesn't work  GERD (REFLUX)  is an extremely common cause of respiratory symptoms just like yours , many times with no obvious heartburn at all.    It can be treated with medication, but also with lifestyle changes including elevation of the head of your bed (ideally with 6 inch  bed blocks),  Smoking cessation, avoidance of late meals, excessive alcohol, and avoid fatty foods, chocolate, peppermint, colas, red wine, and acidic juices such as orange juice.  NO MINT OR MENTHOL PRODUCTS SO NO COUGH DROPS  USE SUGARLESS CANDY INSTEAD (Jolley ranchers or Stover's or Life Savers) or even ice chips will also do - the key is to swallow to prevent all throat clearing.   NO OIL BASED VITAMINS - use powdered substitutes.  The key is to stop smoking completely before smoking completely stops you!    Please schedule a follow up office visit in 4 weeks, sooner if needed

## 2015-07-01 NOTE — Progress Notes (Signed)
Subjective:    Patient ID: Janet Gomez, female    DOB: 03-18-1976,    MRN: [id number]  HPI  39 yowf active smoker with dx of asthma as 3.5 y old always on meds last had good control x sev months in  2015 but since then freq flares despite maint rx with advair referred 07/01/2015 to pulmonary clinic by ED   07/01/2015 1st [facility] office visit/ Jaymison Luber   Chief Complaint  Patient presents with  . Pumonary Consult    Self referral. Pt states she was dxed with Asthma at age 39. She states she has had to go to ER x 2 since Oct 2016. She states her symptoms are currently under control.  She uses albuterol 2 x daily on average.   doe x 50 ft  Wakes up most nights with dry cough / wheeze  Using saba multiple forms to control symptoms    No obvious other patterns in day to day or daytime variabilty or assoc excess / purulent sputum or mucus plugs  or cp or chest tightness, subjective wheeze overt sinus or hb symptoms. No unusual exp hx or h/o childhood pna/ asthma or knowledge of premature birth.  Sleeping ok without nocturnal  or early am exacerbation  of respiratory  c/o's or need for noct saba. Also denies any obvious fluctuation of symptoms with weather or environmental changes or other aggravating or alleviating factors except as outlined above   Current Medications, Allergies, Complete Past Medical History, Past Surgical History, Family History, and Social History were reviewed in [employer] record.                 Review of Systems  Constitutional: Negative for fever, chills and unexpected weight change.  HENT: Negative for congestion, dental problem, ear pain, nosebleeds, postnasal drip, rhinorrhea, sinus pressure, sneezing, sore throat, trouble swallowing and voice change.   Eyes: Negative for visual disturbance.  Respiratory: Negative for cough, choking and shortness of breath.   Cardiovascular: Negative for chest pain and leg swelling.    Gastrointestinal: Negative for vomiting, abdominal pain and diarrhea.  Genitourinary: Negative for difficulty urinating.  Musculoskeletal: Negative for arthralgias.  Skin: Negative for rash.  Neurological: Negative for tremors, syncope and headaches.  Hematological: Does not bruise/bleed easily.       Objective:   Physical Exam   amb obese wf nad  Wt Readings from Last 3 Encounters:  07/01/15 212 lb 9.6 oz (96.435 kg)  06/17/15 206 lb (93.441 kg)    Vital signs reviewed  HEENT: nl dentition, turbinates, and oropharynx. Nl external ear canals without cough reflex   NECK :  without JVD/Nodes/TM/ nl carotid upstrokes bilaterally   LUNGS: no acc muscle use,  Nl contour chest which is clear to A and P bilaterally without cough on insp or exp maneuvers   CV:  RRR  no s3 or murmur or increase in P2, no edema   ABD:  soft and nontender with nl inspiratory excursion in the supine position. No bruits or organomegaly, bowel sounds nl  MS:  Nl gait/ ext warm without deformities, calf tenderness, cyanosis or clubbing No obvious joint restrictions   SKIN: warm and dry without lesions    NEURO:  alert, approp, nl sensorium with  no motor deficits      I personally reviewed images and agree with radiology impression as follows:  CXR:  06/03/15 No abnormality noted.         Assessment & Plan:

## 2015-07-02 ENCOUNTER — Encounter: Payer: Self-pay | Admitting: Internal Medicine

## 2015-07-02 NOTE — Assessment & Plan Note (Addendum)
DDX of  difficult airways management all start with A and  include Adherence, Ace Inhibitors, Acid Reflux, Active Sinus Disease, Alpha 1 Antitripsin deficiency, Anxiety masquerading as Airways dz,  ABPA,  allergy(esp in young), Aspiration (esp in elderly), Adverse effects of meds,  Active smokers, A bunch of PE's (a small clot burden can't cause this syndrome unless there is already severe underlying pulm or vascular dz with poor reserve) plus two Bs  = Bronchiectasis and Beta blocker use..and one C= CHF   In this case Adherence is the biggest issue and starts with  inability to use HFA effectively and also  understand that SABA treats the symptoms but doesn't get to the underlying problem (inflammation).  I used  the analogy of putting steroid cream on a rash to help explain the meaning of topical therapy and the need to get the drug to the target tissue.   - The proper method of use, as well as anticipated side effects, of a metered-dose inhaler are discussed and demonstrated to the patient. Improved effectiveness after extensive coaching during this visit to a level of approximately 75 % from a baseline of 50%   Active smoking is the other big concern, see sep a/p  ? Acid (or non-acid) GERD > always difficult to exclude as up to 75% of pts in some series report no assoc GI/ Heartburn symptoms> rec  diet restrictions/ reviewed and instructions given in writing.   Will try dulera 100 2bid and f/u with pfts   I had an extended discussion with the patient reviewing all relevant studies completed to date and  lasting 35 minutes of a 60 minute visit    Each maintenance medication was reviewed in detail including most importantly the difference between maintenance and prns and under what circumstances the prns are to be triggered using an action plan format that is not reflected in the computer generated alphabetically organized AVS.    Please see instructions for details which were reviewed in writing  and the patient given a copy highlighting the part that I personally wrote and discussed at today's ov.

## 2015-07-02 NOTE — Assessment & Plan Note (Signed)

## 2015-07-02 NOTE — Assessment & Plan Note (Signed)
pfts ordered

## 2015-07-10 ENCOUNTER — Emergency Department (HOSPITAL_COMMUNITY)
Admission: EM | Admit: 2015-07-10 | Discharge: 2015-07-10 | Disposition: A | Discharge status: Home / Self Care | Payer: Medicaid Other | Attending: Emergency Medicine | Admitting: Emergency Medicine

## 2015-07-10 ENCOUNTER — Encounter (HOSPITAL_COMMUNITY): Payer: Self-pay | Admitting: Emergency Medicine

## 2015-07-10 DIAGNOSIS — Z79899 Other long term (current) drug therapy: Secondary | ICD-10-CM | POA: Insufficient documentation

## 2015-07-10 DIAGNOSIS — T699XXA Effect of reduced temperature, unspecified, initial encounter: Secondary | ICD-10-CM

## 2015-07-10 DIAGNOSIS — Z859 Personal history of malignant neoplasm, unspecified: Secondary | ICD-10-CM | POA: Diagnosis not present

## 2015-07-10 DIAGNOSIS — X31XXXA Exposure to excessive natural cold, initial encounter: Secondary | ICD-10-CM | POA: Insufficient documentation

## 2015-07-10 DIAGNOSIS — F17210 Nicotine dependence, cigarettes, uncomplicated: Secondary | ICD-10-CM | POA: Insufficient documentation

## 2015-07-10 DIAGNOSIS — F319 Bipolar disorder, unspecified: Secondary | ICD-10-CM | POA: Diagnosis not present

## 2015-07-10 DIAGNOSIS — Z880 Allergy status to penicillin: Secondary | ICD-10-CM | POA: Diagnosis not present

## 2015-07-10 DIAGNOSIS — R200 Anesthesia of skin: Secondary | ICD-10-CM | POA: Diagnosis present

## 2015-07-10 DIAGNOSIS — T68XXXA Hypothermia, initial encounter: Secondary | ICD-10-CM | POA: Insufficient documentation

## 2015-07-10 DIAGNOSIS — J449 Chronic obstructive pulmonary disease, unspecified: Secondary | ICD-10-CM | POA: Insufficient documentation

## 2015-07-10 DIAGNOSIS — Z791 Long term (current) use of non-steroidal anti-inflammatories (NSAID): Secondary | ICD-10-CM | POA: Diagnosis not present

## 2015-07-10 NOTE — ED Notes (Signed)
Patient placed back on monitor, continuous pulse oximetry and blood pressure cuff; visitor at  bedside 
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
## 2015-07-10 NOTE — ED Notes (Signed)
Patient up ambulatory to the bathroom at this time without any difficulty or distress 

## 2015-07-10 NOTE — ED Notes (Signed)
Hot breakfast tray ordered for pt.

## 2015-07-10 NOTE — ED Notes (Signed)
Pt given warm packs for feet and ordered a hot breakfast for pt
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
## 2015-07-22 ENCOUNTER — Ambulatory Visit: Payer: Medicaid Other | Admitting: Family Medicine

## 2015-08-03 ENCOUNTER — Ambulatory Visit: Payer: Medicaid Other | Admitting: Internal Medicine

## 2015-08-07 ENCOUNTER — Ambulatory Visit: Payer: Medicaid Other | Admitting: Internal Medicine

## 2015-08-10 ENCOUNTER — Telehealth: Payer: Self-pay | Admitting: Internal Medicine

## 2015-08-10 NOTE — Telephone Encounter (Signed)
Spoke with pt. Pt was contacted to move her appointment on 08/13/15. Her appointment has been moved to 08/13/15 at 3:45pm, instead of 2:45pm. Nothing further was needed.

## 2015-08-13 ENCOUNTER — Encounter: Payer: Self-pay | Admitting: Pulmonary Disease

## 2015-08-13 ENCOUNTER — Ambulatory Visit (INDEPENDENT_AMBULATORY_CARE_PROVIDER_SITE_OTHER): Payer: Medicaid Other | Admitting: Pulmonary Disease

## 2015-08-13 ENCOUNTER — Ambulatory Visit: Payer: Medicaid Other | Admitting: Internal Medicine

## 2015-08-13 DIAGNOSIS — J4551 Severe persistent asthma with (acute) exacerbation: Secondary | ICD-10-CM

## 2015-08-13 MED ORDER — CETIRIZINE HCL 10 MG PO TABS: 10.0000 mg | ORAL_TABLET | Freq: Every day | ORAL | Status: AC

## 2015-08-13 MED ORDER — PREDNISONE 10 MG PO TABS: ORAL_TABLET | ORAL | Status: DC

## 2015-08-13 MED ORDER — MOMETASONE FURO-FORMOTEROL FUM 200-5 MCG/ACT IN AERO: 2.0000 | INHALATION_SPRAY | Freq: Two times a day (BID) | RESPIRATORY_TRACT | Status: AC
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
## 2015-08-13 NOTE — Patient Instructions (Signed)
Use DUlera 200-5  -take 2 puffs twice daily Prednisone 10 mg tabs - Take 4 tabs  daily with food x 4 days, then 3 tabs daily x 4 days, then 2 tabs daily x 4 days, then 1 tab daily x4 days then stop. #40   Start singulair 10 mg at bedtime daily Get back on Zyrtec daily

## 2015-08-13 NOTE — Assessment & Plan Note (Signed)
Use DUlera 200-5  -take 2 puffs twice daily Prednisone 10 mg tabs - Take 4 tabs  daily with food x 4 days, then 3 tabs daily x 4 days, then 2 tabs daily x 4 days, then 1 tab daily x4 days then stop. #40   Start singulair 10 mg at bedtime daily Get back on Zyrtec daily  Spirometry pre-post  In the future when bspasm resolved Triggers raise the question of VCD - need to investigate in future Not a great candidate for pred self adm

## 2015-08-13 NOTE — Assessment & Plan Note (Signed)
[place] pre-and post in the future Smoking Cessation emphasized

## 2015-08-16 ENCOUNTER — Encounter (HOSPITAL_COMMUNITY): Payer: Self-pay | Admitting: Emergency Medicine

## 2015-08-16 ENCOUNTER — Emergency Department (HOSPITAL_COMMUNITY)
Admission: EM | Admit: 2015-08-16 | Discharge: 2015-08-16 | Disposition: A | Discharge status: Home / Self Care | Payer: Medicaid Other | Attending: Emergency Medicine | Admitting: Emergency Medicine

## 2015-08-16 ENCOUNTER — Emergency Department (HOSPITAL_COMMUNITY): Payer: Medicaid Other

## 2015-08-16 DIAGNOSIS — Z79899 Other long term (current) drug therapy: Secondary | ICD-10-CM | POA: Insufficient documentation

## 2015-08-16 DIAGNOSIS — Z7952 Long term (current) use of systemic steroids: Secondary | ICD-10-CM | POA: Diagnosis not present

## 2015-08-16 DIAGNOSIS — Y9289 Other specified places as the place of occurrence of the external cause: Secondary | ICD-10-CM | POA: Diagnosis not present

## 2015-08-16 DIAGNOSIS — F319 Bipolar disorder, unspecified: Secondary | ICD-10-CM | POA: Insufficient documentation

## 2015-08-16 DIAGNOSIS — Z791 Long term (current) use of non-steroidal anti-inflammatories (NSAID): Secondary | ICD-10-CM | POA: Insufficient documentation

## 2015-08-16 DIAGNOSIS — F17210 Nicotine dependence, cigarettes, uncomplicated: Secondary | ICD-10-CM | POA: Insufficient documentation

## 2015-08-16 DIAGNOSIS — S0990XA Unspecified injury of head, initial encounter: Secondary | ICD-10-CM | POA: Diagnosis not present

## 2015-08-16 DIAGNOSIS — F4310 Post-traumatic stress disorder, unspecified: Secondary | ICD-10-CM | POA: Diagnosis not present

## 2015-08-16 DIAGNOSIS — S0993XA Unspecified injury of face, initial encounter: Secondary | ICD-10-CM | POA: Diagnosis present

## 2015-08-16 DIAGNOSIS — S0083XA Contusion of other part of head, initial encounter: Secondary | ICD-10-CM | POA: Diagnosis not present

## 2015-08-16 DIAGNOSIS — T50901A Poisoning by unspecified drugs, medicaments and biological substances, accidental (unintentional), initial encounter: Secondary | ICD-10-CM

## 2015-08-16 DIAGNOSIS — Y9389 Activity, other specified: Secondary | ICD-10-CM | POA: Insufficient documentation

## 2015-08-16 DIAGNOSIS — Y998 Other external cause status: Secondary | ICD-10-CM | POA: Insufficient documentation

## 2015-08-16 DIAGNOSIS — Z859 Personal history of malignant neoplasm, unspecified: Secondary | ICD-10-CM | POA: Diagnosis not present

## 2015-08-16 DIAGNOSIS — Z3202 Encounter for pregnancy test, result negative: Secondary | ICD-10-CM | POA: Insufficient documentation

## 2015-08-16 DIAGNOSIS — Z880 Allergy status to penicillin: Secondary | ICD-10-CM | POA: Insufficient documentation

## 2015-08-16 DIAGNOSIS — J441 Chronic obstructive pulmonary disease with (acute) exacerbation: Secondary | ICD-10-CM | POA: Diagnosis not present

## 2015-08-16 DIAGNOSIS — T43591A Poisoning by other antipsychotics and neuroleptics, accidental (unintentional), initial encounter: Secondary | ICD-10-CM | POA: Insufficient documentation

## 2015-08-16 LAB — CBC WITH DIFFERENTIAL/PLATELET
BASOS ABS: 0 10*3/uL (ref 0.0–0.1)
BASOS PCT: 0 %
EOS ABS: 0 10*3/uL (ref 0.0–0.7)
Eosinophils Relative: 1 %
HCT: 41.6 % (ref 36.0–46.0)
HEMOGLOBIN: 13.9 g/dL (ref 12.0–15.0)
Lymphocytes Relative: 36 %
Lymphs Abs: 2.9 10*3/uL (ref 0.7–4.0)
MCH: 29.2 pg (ref 26.0–34.0)
MCHC: 33.4 g/dL (ref 30.0–36.0)
MCV: 87.4 fL (ref 78.0–100.0)
Monocytes Absolute: 0.7 10*3/uL (ref 0.1–1.0)
Monocytes Relative: 9 %
NEUTROS PCT: 54 %
Neutro Abs: 4.5 10*3/uL (ref 1.7–7.7)
PLATELETS: 297 10*3/uL (ref 150–400)
RBC: 4.76 MIL/uL (ref 3.87–5.11)
RDW: 13.7 % (ref 11.5–15.5)
WBC: 8.2 10*3/uL (ref 4.0–10.5)

## 2015-08-16 LAB — COMPREHENSIVE METABOLIC PANEL
ALK PHOS: 83 U/L (ref 38–126)
ALT: 13 U/L — AB (ref 14–54)
AST: 27 U/L (ref 15–41)
Albumin: 4 g/dL (ref 3.5–5.0)
Anion gap: 10 (ref 5–15)
BUN: 9 mg/dL (ref 6–20)
CHLORIDE: 110 mmol/L (ref 101–111)
CO2: 22 mmol/L (ref 22–32)
CREATININE: 0.89 mg/dL (ref 0.44–1.00)
Calcium: 9.6 mg/dL (ref 8.9–10.3)
Glucose, Bld: 112 mg/dL — ABNORMAL HIGH (ref 65–99)
Potassium: 4.8 mmol/L (ref 3.5–5.1)
SODIUM: 142 mmol/L (ref 135–145)
Total Bilirubin: 1.1 mg/dL (ref 0.3–1.2)
Total Protein: 7 g/dL (ref 6.5–8.1)

## 2015-08-16 LAB — ACETAMINOPHEN LEVEL

## 2015-08-16 LAB — ETHANOL: Alcohol, Ethyl (B): <5 mg/dL (ref <5)

## 2015-08-16 LAB — SALICYLATE LEVEL

## 2015-08-16 LAB — RAPID URINE DRUG SCREEN, HOSP PERFORMED
AMPHETAMINES: NOT DETECTED
Barbiturates: NOT DETECTED
Benzodiazepines: NOT DETECTED
COCAINE: NOT DETECTED
OPIATES: NOT DETECTED
TETRAHYDROCANNABINOL: NOT DETECTED

## 2015-08-16 MED ORDER — ONDANSETRON 8 MG PO TBDP
8.0000 mg | ORAL_TABLET | Freq: Once | ORAL | Status: AC
  Administered 2015-08-16: 8 mg via ORAL
  Filled 2015-08-16: qty 1

## 2015-08-16 MED ORDER — ACETAMINOPHEN 325 MG PO TABS
650.0000 mg | ORAL_TABLET | Freq: Once | ORAL | Status: AC
  Administered 2015-08-16: 650 mg via ORAL
  Filled 2015-08-16: qty 2
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
## 2015-08-16 NOTE — ED Notes (Signed)
Bed: RESA Expected date:  Expected time:  Means of arrival:  Comments: EMS- buspirone OD
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
## 2015-08-19 ENCOUNTER — Encounter (HOSPITAL_BASED_OUTPATIENT_CLINIC_OR_DEPARTMENT_OTHER): Payer: Medicaid Other | Admitting: Clinical

## 2015-08-19 DIAGNOSIS — Z658 Other specified problems related to psychosocial circumstances: Secondary | ICD-10-CM

## 2015-08-19 MED FILL — ALL DAY ALLERGY 10 MG TAB: 10 | 30 days supply | Qty: 30 | Fill #0

## 2015-08-19 MED FILL — predniSONE 10 MG TABS: 10 | 16 days supply | Qty: 40 | Fill #0

## 2015-08-19 MED FILL — DULERA 200 MCG/5 MCG INH: 200-5 | 30 days supply | Qty: 13 | Fill #0

## 2015-08-19 NOTE — Progress Notes (Signed)
ASSESSMENT: Pt currently experiencing numerous psychosocial stressors, needs to f/u with PCP and would benefit from supportive counseling regarding coping with psychosocial stressors. Stage of Change: precontemplative  PLAN: 1. F/U with behavioral health consultant in as needed 2. Psychiatric Medications: Buspar, Zoloft. 3. Behavioral recommendation(s):   -Pick up medications today in pharmacy -Make appointment to see PCP at front desk today -Take home food resources today -Consider food pantries in community ([place]) -Consider hospice grief counseling  SUBJECTIVE: Pt. referred by self for psychosocial:  Pt. reports the following symptoms/concerns: Pt called, then came into office, stating that she was unable to obtain her medications at [facility] because she did not have the copay, and that she was almost out of food at home. Her primary concerns today were obtaining food and medication, and is thinking about coming back to talk further about recent deaths in family and other family stressors. Duration of problem: at least one day Severity: mild  OBJECTIVE: Orientation & Cognition: Oriented x3. Thought processes normal and appropriate to situation. Mood: teary. Affect: appropriate Appearance: appropriate Risk of harm to self or others: no known risk of harm to self or others Substance use: tobacco Assessments administered: none  Diagnosis: Psychosocial stressors CPT Code: Z65.8 -------------------------------------------- Other(s) present in the room: none  Time spent with patient in exam room: 10 minutes

## 2015-08-19 NOTE — Patient Instructions (Signed)
PLAN:  1. F/U with behavioral health consultant as needed 2. Psychiatric Medications: Buspar, Zoloft 3. Behavioral recommendation(s):  -Pick up medications today in pharmacy -Make appointment to see PCP at front desk today -Take home food resources today -Consider food pantries in community([place]) -Consider hospice grief counseling

## 2015-08-21 ENCOUNTER — Encounter (HOSPITAL_COMMUNITY): Payer: Self-pay | Admitting: Emergency Medicine

## 2015-08-21 ENCOUNTER — Emergency Department (HOSPITAL_COMMUNITY)
Admission: EM | Admit: 2015-08-21 | Discharge: 2015-08-21 | Disposition: A | Discharge status: Home / Self Care | Payer: Medicaid Other | Attending: Emergency Medicine | Admitting: Emergency Medicine

## 2015-08-21 ENCOUNTER — Emergency Department (HOSPITAL_COMMUNITY): Payer: Medicaid Other

## 2015-08-21 DIAGNOSIS — Z859 Personal history of malignant neoplasm, unspecified: Secondary | ICD-10-CM | POA: Insufficient documentation

## 2015-08-21 DIAGNOSIS — F17210 Nicotine dependence, cigarettes, uncomplicated: Secondary | ICD-10-CM | POA: Diagnosis not present

## 2015-08-21 DIAGNOSIS — W01198A Fall on same level from slipping, tripping and stumbling with subsequent striking against other object, initial encounter: Secondary | ICD-10-CM | POA: Diagnosis not present

## 2015-08-21 DIAGNOSIS — Y998 Other external cause status: Secondary | ICD-10-CM | POA: Diagnosis not present

## 2015-08-21 DIAGNOSIS — S8991XA Unspecified injury of right lower leg, initial encounter: Secondary | ICD-10-CM | POA: Insufficient documentation

## 2015-08-21 DIAGNOSIS — Z79899 Other long term (current) drug therapy: Secondary | ICD-10-CM | POA: Diagnosis not present

## 2015-08-21 DIAGNOSIS — Z791 Long term (current) use of non-steroidal anti-inflammatories (NSAID): Secondary | ICD-10-CM | POA: Insufficient documentation

## 2015-08-21 DIAGNOSIS — Y93E1 Activity, personal bathing and showering: Secondary | ICD-10-CM | POA: Diagnosis not present

## 2015-08-21 DIAGNOSIS — F319 Bipolar disorder, unspecified: Secondary | ICD-10-CM | POA: Insufficient documentation

## 2015-08-21 DIAGNOSIS — Y92002 Bathroom of unspecified non-institutional (private) residence single-family (private) house as the place of occurrence of the external cause: Secondary | ICD-10-CM | POA: Insufficient documentation

## 2015-08-21 DIAGNOSIS — Z7951 Long term (current) use of inhaled steroids: Secondary | ICD-10-CM | POA: Diagnosis not present

## 2015-08-21 DIAGNOSIS — F4310 Post-traumatic stress disorder, unspecified: Secondary | ICD-10-CM | POA: Diagnosis not present

## 2015-08-21 DIAGNOSIS — Z880 Allergy status to penicillin: Secondary | ICD-10-CM | POA: Insufficient documentation

## 2015-08-21 DIAGNOSIS — J449 Chronic obstructive pulmonary disease, unspecified: Secondary | ICD-10-CM | POA: Diagnosis not present

## 2015-08-21 DIAGNOSIS — M79604 Pain in right leg: Secondary | ICD-10-CM

## 2015-08-21 MED ORDER — DICLOFENAC SODIUM 1 % TD GEL: 2.0000 g | Freq: Four times a day (QID) | TRANSDERMAL | Status: AC

## 2015-08-21 MED ORDER — IBUPROFEN 400 MG PO TABS
800.0000 mg | ORAL_TABLET | Freq: Once | ORAL | Status: AC
  Administered 2015-08-21: 800 mg via ORAL
  Filled 2015-08-21: qty 2

## 2015-08-21 MED ORDER — IBUPROFEN 800 MG PO TABS: 800.0000 mg | ORAL_TABLET | Freq: Three times a day (TID) | ORAL | Status: AC

## 2015-08-21 NOTE — ED Notes (Signed)
Pt st;s she slid getting out of the bath tub and hit her lower leg on the tub.  Pt c/o pain to right lower leg

## 2015-08-21 NOTE — ED Provider Notes (Signed)
CSN: [id number]     Arrival date & time 08/21/15  1953 History  By signing my name below, I, Soijett Blue, attest that this documentation has been prepared under the direction and in the presence of Shawn Joy, PA-C Electronically Signed: Soijett Blue, ED Scribe. 08/21/2015. 8:40 PM.    Chief Complaint  Patient presents with  . Leg Injury     The history is provided by the patient. No language interpreter was used.    Shermaine Heaps is a 40 y.o. female with a medical hx of CA who presents to the Emergency Department complaining of 8/10, burning, right leg injury occurring PTA. She notes that she was getting out of the shower when the rug slipped underneath her. She reports that she struck her right leg on the bath tub at the time of the incidence. Patient states that her only injury to this leg was in childhood. Pt is having associated symptoms of gait problem due to pain. She notes that she has tried tiger balm without relief of her symptoms. She notes that she took naprosyn earlier today before the incident occurred. She denies hitting her head, LOC, neck pain, back pain, neuro deficits, and any other symptoms. Denies bone disorders, such as osteoporosis.   Per pt chart review: Pt was seen on 08/16/2015 in the ED for an accidental drug overdose and had labs, and a CT maxillofacial imaging. Pt was then observed in the ED for any adverse effects of the medication and discharged home.    Past Medical History  Diagnosis Date  . Asthma   . Cancer ([place])   . Seizures ([place])   . COPD (chronic obstructive pulmonary disease) ([place])   . PTSD (post-traumatic stress disorder)   . Bipolar 1 disorder [facility])    Past Surgical History  Procedure Laterality Date  . Tonsillectomy    . Appendectomy    . Cholecystectomy    . Tubal ligation    . Cesarean section     Family History  Problem Relation Age of Onset  . Emphysema Mother     smoked  . Emphysema Father     smoked  . Heart disease Paternal  Grandmother   . Heart disease Mother   . Heart disease Father   . Clotting disorder Mother    Social History  Substance Use Topics  . Smoking status: Current Every Day Smoker -- 0.50 packs/day for 27 years    Types: Cigarettes  . Smokeless tobacco: Never Used  . Alcohol Use: No   OB History    No data available     Review of Systems  Musculoskeletal: Positive for gait problem (due to pain). Negative for back pain and neck pain.       Right lower leg pain  Skin: Negative for color change, rash and wound.  Neurological: Negative for syncope.      Allergies  Chicken flavor; Cyclobenzaprine; Gluten meal; Levaquin; Levofloxacin; Peanut oil; Penicillins; Shellfish allergy; Wheat extract; Diazepam; Eggs or egg-derived products; Aspirin; Diphenhydramine; and Theophyllines  Home Medications   Prior to Admission medications   Medication Sig Start Date End Date Taking? Authorizing Provider  albuterol (PROVENTIL HFA;VENTOLIN HFA) 108 (90 BASE) MCG/ACT inhaler Inhale 1 puff into the lungs every 4 (four) hours as needed for wheezing or shortness of breath.    Historical Provider, MD  albuterol (PROVENTIL) (2.5 MG/3ML) 0.083% nebulizer solution Take 2.5 mg by nebulization every 6 (six) hours as needed for wheezing or shortness of breath.  Historical Provider, MD  atorvastatin (LIPITOR) 20 MG tablet Take 20 mg by mouth daily.    Historical Provider, MD  busPIRone (BUSPAR) 15 MG tablet Take 15 mg by mouth 3 (three) times daily.    Historical Provider, MD  cetirizine (ZYRTEC ALLERGY) 10 MG tablet Take 1 tablet (10 mg total) by mouth daily. 08/13/15   Rigoberto Noel, MD  diclofenac sodium (VOLTAREN) 1 % GEL Apply 2 g topically 4 (four) times daily. 08/21/15   Shawn C Joy, PA-C  EPINEPHrine (EPIPEN 2-PAK) 0.3 mg/0.3 mL IJ SOAJ injection Inject into the muscle as needed. 12/07/13   Historical Provider, MD  ergocalciferol (VITAMIN D2) 50000 UNITS capsule Take 50,000 Units by mouth once a week. Take on  Thursdays    Historical Provider, MD  ibuprofen (ADVIL,MOTRIN) 800 MG tablet Take 1 tablet (800 mg total) by mouth 3 (three) times daily. 08/21/15   Shawn C Joy, PA-C  IRON PO Take 1 tablet by mouth daily.    Historical Provider, MD  lamoTRIgine (LAMICTAL) 25 MG tablet Take 50 mg by mouth 2 (two) times daily.    Historical Provider, MD  mometasone-formoterol (DULERA) 200-5 MCG/ACT AERO Inhale 2 puffs into the lungs 2 (two) times daily. 08/13/15   Rigoberto Noel, MD  naproxen (NAPROSYN) 500 MG tablet Take 1 tablet (500 mg total) by mouth 2 (two) times daily with a meal. 06/17/15   Arnoldo Morale, MD  predniSONE (DELTASONE) 10 MG tablet Take 4 tab daily with food x 4 day, 3 tab daily x 4 day, 2 tabs daily x 4 day,1 tab daily x4 day then stop 08/13/15   Rigoberto Noel, MD  sertraline (ZOLOFT) 50 MG tablet Take 50 mg by mouth daily.    Historical Provider, MD  sodium chloride (OCEAN) 0.65 % SOLN nasal spray Place 1 spray into both nostrils as needed for congestion. 12/08/14   Montine Circle, PA-C  vitamin B-12 (CYANOCOBALAMIN) 1000 MCG tablet Take 1,000 mcg by mouth daily.    Historical Provider, MD   BP 140/80 mmHg  Pulse 93  Temp(Src) 97.9 F (36.6 C) (Oral)  Resp 16  SpO2 96%  LMP 08/04/2015 Physical Exam  Constitutional: She is oriented to person, place, and time. She appears well-developed and well-nourished. No distress.  HENT:  Head: Normocephalic and atraumatic.  Eyes: EOM are normal.  Neck: Neck supple.  Cardiovascular: Normal rate and intact distal pulses.   Pulmonary/Chest: Effort normal. No respiratory distress.  Musculoskeletal: Normal range of motion.  Tenderness to the right anterior lower leg. CMS intact distally. No swelling, erythema, or deformity. ROM fully intact. Patient was able to bear weight and ambulate without assistance.  Neurological: She is alert and oriented to person, place, and time.  No sensory deficits. Strength 5 out of 5.  Skin: Skin is warm and dry.   Psychiatric: She has a normal mood and affect. Her behavior is normal.  Nursing note and vitals reviewed.   ED Course  Procedures (including critical care time) DIAGNOSTIC STUDIES: Oxygen Saturation is 96% on RA, nl by my interpretation.    COORDINATION OF CARE: 8:39 PM Discussed treatment plan with pt at bedside which includes right tib/fib xray and pt agreed to plan.    Labs Review Labs Reviewed - No data to display  Imaging Review Dg Tibia/fibula Right  08/21/2015  CLINICAL DATA:  Initial evaluation for acute trauma, fall. Lower leg pain. EXAM: RIGHT TIBIA AND FIBULA - 2 VIEW COMPARISON:  None. FINDINGS: No acute fracture  or dislocation. Limited views of the knee and ankle within normal limits. Osseous mineralization normal. Minimal soft tissue swelling at the anterior aspect of the mid leg, suggestive of small contusion. IMPRESSION: 1. No acute fracture or dislocation. 2. Minimal soft tissue swelling at the anterior aspect of the right leg, suggestive of mild contusion. Electronically Signed   By: Jeannine Boga M.D.   On: 08/21/2015 21:25   I have personally reviewed and evaluated these images as part of my medical decision-making.   EKG Interpretation None      MDM   Final diagnoses:  Leg pain, anterior, right    Tricha Ziebell presents with right lower leg pain following a fall today.  This patient shows no indication of obvious injury on exam other than some minor tenderness. X-ray showed no fractures or dislocations. Suspect that the patient's pain is due to soft tissue injury. Patient was given instructions for home care as well as return precautions. Patient voiced understanding of these instructions and is comfortable with discharge.  I personally performed the services described in this documentation, which was scribed in my presence. The recorded information has been reviewed and is accurate.    Lorayne Bender, PA-C 08/21/15 [place],  MD 08/22/15 Laureen Abrahams

## 2015-08-21 NOTE — Discharge Instructions (Signed)
You have been seen today for leg pain. Your imaging showed no abnormalities. Follow up with PCP as needed. Return to ED should symptoms worsen. Use ice to reduce pain and inflammation. Ibuprofen or Tylenol for pain control. Elevate the leg when not in use.

## 2015-08-26 ENCOUNTER — Telehealth: Payer: Self-pay | Admitting: Family Medicine

## 2015-08-26 ENCOUNTER — Encounter: Payer: Self-pay | Admitting: Family Medicine

## 2015-08-26 ENCOUNTER — Ambulatory Visit: Payer: Medicaid Other | Attending: Family Medicine | Admitting: Family Medicine

## 2015-08-26 ENCOUNTER — Other Ambulatory Visit: Payer: Self-pay | Admitting: Family Medicine

## 2015-08-26 VITALS — BP 107/71 | HR 103 | Temp 99.1°F | Resp 16 | Ht 66.0 in | Wt 218.0 lb

## 2015-08-26 DIAGNOSIS — R569 Unspecified convulsions: Secondary | ICD-10-CM | POA: Diagnosis not present

## 2015-08-26 DIAGNOSIS — M4120 Other idiopathic scoliosis, site unspecified: Secondary | ICD-10-CM

## 2015-08-26 DIAGNOSIS — F319 Bipolar disorder, unspecified: Secondary | ICD-10-CM | POA: Insufficient documentation

## 2015-08-26 DIAGNOSIS — Z79899 Other long term (current) drug therapy: Secondary | ICD-10-CM | POA: Diagnosis not present

## 2015-08-26 DIAGNOSIS — J4551 Severe persistent asthma with (acute) exacerbation: Secondary | ICD-10-CM

## 2015-08-26 DIAGNOSIS — Z888 Allergy status to other drugs, medicaments and biological substances status: Secondary | ICD-10-CM | POA: Insufficient documentation

## 2015-08-26 DIAGNOSIS — M545 Low back pain: Secondary | ICD-10-CM | POA: Insufficient documentation

## 2015-08-26 DIAGNOSIS — Z853 Personal history of malignant neoplasm of breast: Secondary | ICD-10-CM | POA: Insufficient documentation

## 2015-08-26 DIAGNOSIS — J449 Chronic obstructive pulmonary disease, unspecified: Secondary | ICD-10-CM | POA: Diagnosis not present

## 2015-08-26 DIAGNOSIS — J441 Chronic obstructive pulmonary disease with (acute) exacerbation: Secondary | ICD-10-CM | POA: Diagnosis not present

## 2015-08-26 DIAGNOSIS — J45901 Unspecified asthma with (acute) exacerbation: Secondary | ICD-10-CM | POA: Insufficient documentation

## 2015-08-26 DIAGNOSIS — F17210 Nicotine dependence, cigarettes, uncomplicated: Secondary | ICD-10-CM | POA: Diagnosis not present

## 2015-08-26 DIAGNOSIS — Z880 Allergy status to penicillin: Secondary | ICD-10-CM | POA: Insufficient documentation

## 2015-08-26 DIAGNOSIS — F4310 Post-traumatic stress disorder, unspecified: Secondary | ICD-10-CM | POA: Diagnosis not present

## 2015-08-26 DIAGNOSIS — T148 Other injury of unspecified body region: Secondary | ICD-10-CM

## 2015-08-26 DIAGNOSIS — W57XXXA Bitten or stung by nonvenomous insect and other nonvenomous arthropods, initial encounter: Secondary | ICD-10-CM | POA: Insufficient documentation

## 2015-08-26 DIAGNOSIS — E785 Hyperlipidemia, unspecified: Secondary | ICD-10-CM | POA: Diagnosis present

## 2015-08-26 DIAGNOSIS — E119 Type 2 diabetes mellitus without complications: Secondary | ICD-10-CM

## 2015-08-26 DIAGNOSIS — M419 Scoliosis, unspecified: Secondary | ICD-10-CM | POA: Insufficient documentation

## 2015-08-26 LAB — POCT GLYCOSYLATED HEMOGLOBIN (HGB A1C): Hemoglobin A1C: 6.5

## 2015-08-26 MED ORDER — GLUCOSE BLOOD VI STRP: ORAL_STRIP | Status: DC

## 2015-08-26 MED ORDER — TRUE METRIX METER DEVI: 1.0000 | Freq: Three times a day (TID) | Status: DC

## 2015-08-26 MED ORDER — MELOXICAM 7.5 MG PO TABS: 7.5000 mg | ORAL_TABLET | Freq: Every day | ORAL | Status: AC

## 2015-08-26 MED ORDER — TRUEPLUS LANCETS 28G MISC: 1.0000 | Freq: Three times a day (TID) | Status: DC

## 2015-08-26 NOTE — Patient Instructions (Signed)
Diabetes Mellitus and Food It is important for you to manage your blood sugar (glucose) level. Your blood glucose level can be greatly affected by what you eat. Eating healthier foods in the appropriate amounts throughout the day at about the same time each day will help you control your blood glucose level. It can also help slow or prevent worsening of your diabetes mellitus. Healthy eating may even help you improve the level of your blood pressure and reach or maintain a healthy weight.  General recommendations for healthful eating and cooking habits include:  Eating meals and snacks regularly. Avoid going long periods of time without eating to lose weight.  Eating a diet that consists mainly of plant-based foods, such as fruits, vegetables, nuts, legumes, and whole grains.  Using low-heat cooking methods, such as baking, instead of high-heat cooking methods, such as deep frying. Work with your dietitian to make sure you understand how to use the Nutrition Facts information on food labels. HOW CAN FOOD AFFECT ME? Carbohydrates Carbohydrates affect your blood glucose level more than any other type of food. Your dietitian will help you determine how many carbohydrates to eat at each meal and teach you how to count carbohydrates. Counting carbohydrates is important to keep your blood glucose at a healthy level, especially if you are using insulin or taking certain medicines for diabetes mellitus. Alcohol Alcohol can cause sudden decreases in blood glucose (hypoglycemia), especially if you use insulin or take certain medicines for diabetes mellitus. Hypoglycemia can be a life-threatening condition. Symptoms of hypoglycemia (sleepiness, dizziness, and disorientation) are similar to symptoms of having too much alcohol.  If your health care provider has given you approval to drink alcohol, do so in moderation and use the following guidelines:  Women should not have more than one drink per day, and men  should not have more than two drinks per day. One drink is equal to:  12 oz of beer.  5 oz of wine.  1 oz of hard liquor.  Do not drink on an empty stomach.  Keep yourself hydrated. Have water, diet soda, or unsweetened iced tea.  Regular soda, juice, and other mixers might contain a lot of carbohydrates and should be counted. WHAT FOODS ARE NOT RECOMMENDED? As you make food choices, it is important to remember that all foods are not the same. Some foods have fewer nutrients per serving than other foods, even though they might have the same number of calories or carbohydrates. It is difficult to get your body what it needs when you eat foods with fewer nutrients. Examples of foods that you should avoid that are high in calories and carbohydrates but low in nutrients include:  Trans fats (most processed foods list trans fats on the Nutrition Facts label).  Regular soda.  Juice.  Candy.  Sweets, such as cake, pie, doughnuts, and cookies.  Fried foods. WHAT FOODS CAN I EAT? Eat nutrient-rich foods, which will nourish your body and keep you healthy. The food you should eat also will depend on several factors, including:  The calories you need.  The medicines you take.  Your weight.  Your blood glucose level.  Your blood pressure level.  Your cholesterol level. You should eat a variety of foods, including:  Protein.  Lean cuts of meat.  Proteins low in saturated fats, such as fish, egg whites, and beans. Avoid processed meats.  Fruits and vegetables.  Fruits and vegetables that may help control blood glucose levels, such as apples, mangoes, and   yams.  Dairy products.  Choose fat-free or low-fat dairy products, such as milk, yogurt, and cheese.  Grains, bread, pasta, and rice.  Choose whole grain products, such as multigrain bread, whole oats, and brown rice. These foods may help control blood pressure.  Fats.  Foods containing healthful fats, such as nuts,  avocado, olive oil, canola oil, and fish. DOES EVERYONE WITH DIABETES MELLITUS HAVE THE SAME MEAL PLAN? Because every person with diabetes mellitus is different, there is not one meal plan that works for everyone. It is very important that you meet with a dietitian who will help you create a meal plan that is just right for you.   This information is not intended to replace advice given to you by your health care provider. Make sure you discuss any questions you have with your health care provider.   Document Released: 04/07/2005 Document Revised: 08/01/2014 Document Reviewed: 06/07/2013 Elsevier Interactive Patient Education 2016 [employer]  

## 2015-08-26 NOTE — Progress Notes (Signed)
Patient's here for asthma f/up medication refills.  Patient still having power back pain since last visit. Stating that it has gotten worse, rated 9/10.   Hand still causing problem from last OV.  Patient requesting refill on Vit D, and change naproxen because it doesn't work.  Patient agree to A1c screening.

## 2015-08-26 NOTE — Telephone Encounter (Signed)
Pt called states most of medication that were prescribed are not covered by medicaid, patient would like to know if she can get others that are covered. Please f/u

## 2015-08-26 NOTE — Progress Notes (Signed)
CC:  HPI: Janet Gomez is a 40 y.o. female with a history of COPD, asthma, low back pain from scoliosis, bipolar disorder, seizures, hyperlipidemia, history of breast cancer who comes into the clinic for a follow-up visit.  She was recently seen by [facility] and had COPD/asthma medications were optimized with the change from Advair to [facility] and a plan for spirometry in the near future.Denies any recent asthma exacerbation.  Referred to psychiatry at her last visit but she states she has not had the time to make an appointment but still has residual bipolar medications which she is still taking from her previous physician. She complains of lower back pain from scoliosis and states naproxen is not helping; she would like something else. She also has a pruritic lesion on the right hand which is suspect is from a bug bite Patient has No headache, No chest pain, No abdominal pain - No Nausea, No new weakness tingling or numbness, No Cough - SOB.  Allergies  Allergen Reactions  . Chicken Flavor Anaphylaxis  . Cyclobenzaprine Anaphylaxis  . Gluten Meal Anaphylaxis  . Levaquin [Levofloxacin In D5w] Hives and Shortness Of Breath  . Levofloxacin Anaphylaxis and Other (See Comments)    Other Reaction: THROAT CLOSES  . Peanut Oil Anaphylaxis  . Penicillins Hives and Shortness Of Breath    All cross reactors also .Marland KitchenHas patient had a PCN reaction causing immediate rash, facial/tongue/throat swelling, SOB or lightheadedness with hypotension: Yes Has patient had a PCN reaction causing severe rash involving mucus membranes or skin necrosis: Yes Has patient had a PCN reaction that required hospitalization Yes Has patient had a PCN reaction occurring within the last 10 years: No If all of the above answers are "NO", then may proceed with Cephalosporin use.   . Shellfish Allergy Anaphylaxis  . Wheat Extract Anaphylaxis  . Diazepam Swelling  . Eggs Or Egg-Derived Products Other (See  Comments)    Unknown reaction "I just tested positive for allergy"   . Aspirin Other (See Comments)  . Diphenhydramine Hives  . Theophyllines Hives   Past Medical History  Diagnosis Date  . Asthma   . Cancer ([place])   . Seizures ([place])   . COPD (chronic obstructive pulmonary disease) ([place])   . PTSD (post-traumatic stress disorder)   . Bipolar 1 disorder [facility])    Current Outpatient Prescriptions on File Prior to Visit  Medication Sig Dispense Refill  . albuterol (PROVENTIL HFA;VENTOLIN HFA) 108 (90 BASE) MCG/ACT inhaler Inhale 1 puff into the lungs every 4 (four) hours as needed for wheezing or shortness of breath.    Marland Kitchen albuterol (PROVENTIL) (2.5 MG/3ML) 0.083% nebulizer solution Take 2.5 mg by nebulization every 6 (six) hours as needed for wheezing or shortness of breath.    Marland Kitchen atorvastatin (LIPITOR) 20 MG tablet Take 20 mg by mouth daily.    . busPIRone (BUSPAR) 15 MG tablet Take 15 mg by mouth 3 (three) times daily.    . cetirizine (ZYRTEC ALLERGY) 10 MG tablet Take 1 tablet (10 mg total) by mouth daily. 30 tablet 0  . diclofenac sodium (VOLTAREN) 1 % GEL Apply 2 g topically 4 (four) times daily. 100 g 0  . EPINEPHrine (EPIPEN 2-PAK) 0.3 mg/0.3 mL IJ SOAJ injection Inject into the muscle as needed.    . ergocalciferol (VITAMIN D2) 50000 UNITS capsule Take 50,000 Units by mouth once a week. Take on Thursdays    . ibuprofen (ADVIL,MOTRIN) 800 MG tablet Take 1 tablet (800 mg total)  by mouth 3 (three) times daily. 21 tablet 0  . IRON PO Take 1 tablet by mouth daily.    Marland Kitchen lamoTRIgine (LAMICTAL) 25 MG tablet Take 50 mg by mouth 2 (two) times daily.    . mometasone-formoterol (DULERA) 200-5 MCG/ACT AERO Inhale 2 puffs into the lungs 2 (two) times daily. 1 Inhaler 3  . naproxen (NAPROSYN) 500 MG tablet Take 1 tablet (500 mg total) by mouth 2 (two) times daily with a meal. 60 tablet 1  . predniSONE (DELTASONE) 10 MG tablet Take 4 tab daily with food x 4 day, 3 tab daily x 4 day, 2 tabs daily x 4  day,1 tab daily x4 day then stop 40 tablet 0  . sertraline (ZOLOFT) 50 MG tablet Take 50 mg by mouth daily.    . sodium chloride (OCEAN) 0.65 % SOLN nasal spray Place 1 spray into both nostrils as needed for congestion. 1 Bottle 0  . vitamin B-12 (CYANOCOBALAMIN) 1000 MCG tablet Take 1,000 mcg by mouth daily.     No current facility-administered medications on file prior to visit.   Family History  Problem Relation Age of Onset  . Emphysema Mother     smoked  . Emphysema Father     smoked  . Heart disease Paternal Grandmother   . Heart disease Mother   . Heart disease Father   . Clotting disorder Mother    Social History   Social History  . Marital Status: Married    Spouse Name: N/A  . Number of Children: N/A  . Years of Education: N/A   Occupational History  . Disabled    Social History Main Topics  . Smoking status: Current Every Day Smoker -- 0.50 packs/day for 27 years    Types: Cigarettes  . Smokeless tobacco: Never Used  . Alcohol Use: No  . Drug Use: No  . Sexual Activity: Not on file   Other Topics Concern  . Not on file   Social History Narrative    Review of Systems: Constitutional: Negative for fever, chills, diaphoresis, activity change, appetite change and fatigue. HENT: Negative for ear pain, nosebleeds, congestion, facial swelling, rhinorrhea, neck pain, neck stiffness and ear discharge.  Eyes: Negative for pain, discharge, redness, itching and visual disturbance. Respiratory: Negative for cough, choking, chest tightness, shortness of breath, wheezing and stridor.  Cardiovascular: Negative for chest pain, palpitations and leg swelling. Gastrointestinal: Negative for abdominal distention. Genitourinary: Negative for dysuria, urgency, frequency, hematuria, flank pain, decreased urine volume, difficulty urinating and dyspareunia.  Musculoskeletal: Positive for back pain, negative for joint swelling, arthralgias and gait problem. Neurological: Negative  for dizziness, tremors, seizures, syncope, facial asymmetry, speech difficulty, weakness, light-headedness, numbness and headaches.  Skin: Rash on dorsum of right hand Hematological: Negative for adenopathy. Does not bruise/bleed easily. Psychiatric/Behavioral: Negative for hallucinations, behavioral problems, confusion, dysphoric mood, decreased concentration and agitation.    Objective:   Filed Vitals:   08/26/15 0909  BP: 107/71  Pulse: 103  Temp: 99.1 F (37.3 C)  Resp: 16    Physical Exam: Constitutional: Patient appears well-developed and well-nourished. No distress. Neck: Normal ROM. Neck supple. No JVD. No tracheal deviation. No thyromegaly. CVS: RRR, S1/S2 +, no murmurs, no gallops, no carotid bruit.  Pulmonary: Effort and breath sounds normal, no stridor, rhonchi, wheezes, rales.  Abdominal: Soft. BS +,  no distension, tenderness, rebound or guarding.  Musculoskeletal: Normal range of motion. No edema and no tenderness.  Lymphadenopathy: No lymphadenopathy noted, cervical, inguinal or axillary Neuro:  Alert. Normal reflexes, muscle tone coordination. No cranial nerve deficit. Skin: Right hand insect bite.  Psychiatric: Normal mood and affect. Behavior, judgment, thought content normal.  Lab Results  Component Value Date   WBC 8.2 08/16/2015   HGB 13.9 08/16/2015   HCT 41.6 08/16/2015   MCV 87.4 08/16/2015   PLT 297 08/16/2015   Lab Results  Component Value Date   CREATININE 0.89 08/16/2015   BUN 9 08/16/2015   NA 142 08/16/2015   K 4.8 08/16/2015   CL 110 08/16/2015   CO2 22 08/16/2015    Lab Results  Component Value Date   HGBA1C 6.50 08/26/2015        Assessment and plan:   COPD and Asthma exacerbation: Controlled Continue Dulera, Proventil Advised that smoking cessation will reduce frequency of exacerbations  Bipolar Disorder: She remains on Lamictal, Zoloft Referral to Psych for optimization of management but she hasn't had time to go to  [place]  Seizures: No recent seizures in the last few years. Continue Lamictal  Scoliosis/Lumbago: Unontrolled on naproxen Placed on meloxicam   Bug bite: Placed on topical Hydrocortisone   History of breast ca: Referred to Oncology for close surveillance  Tobacco abuse: Spent 3 minutes discussing cessation She is not ready to quit.  Arnoldo Morale, [facility] [phone] 08/26/2015, 9:28 AM

## 2015-08-27 ENCOUNTER — Ambulatory Visit (INDEPENDENT_AMBULATORY_CARE_PROVIDER_SITE_OTHER): Payer: Medicaid Other | Admitting: Internal Medicine

## 2015-08-27 ENCOUNTER — Telehealth: Payer: Self-pay | Admitting: Genetic Counselor

## 2015-08-27 ENCOUNTER — Other Ambulatory Visit: Payer: Self-pay | Admitting: Family Medicine

## 2015-08-27 ENCOUNTER — Encounter: Payer: Self-pay | Admitting: Internal Medicine

## 2015-08-27 VITALS — BP 122/78 | HR 106 | Ht 66.0 in | Wt 212.8 lb

## 2015-08-27 DIAGNOSIS — R0600 Dyspnea, unspecified: Secondary | ICD-10-CM | POA: Diagnosis not present

## 2015-08-27 DIAGNOSIS — J449 Chronic obstructive pulmonary disease, unspecified: Secondary | ICD-10-CM | POA: Diagnosis not present

## 2015-08-27 DIAGNOSIS — J4530 Mild persistent asthma, uncomplicated: Secondary | ICD-10-CM

## 2015-08-27 DIAGNOSIS — E119 Type 2 diabetes mellitus without complications: Secondary | ICD-10-CM

## 2015-08-27 DIAGNOSIS — F17210 Nicotine dependence, cigarettes, uncomplicated: Secondary | ICD-10-CM

## 2015-08-27 MED ORDER — ACCU-CHEK SOFTCLIX LANCET DEV MISC: Status: DC

## 2015-08-27 MED ORDER — ACCU-CHEK AVIVA PLUS W/DEVICE KIT: 1.0000 | PACK | Freq: Three times a day (TID) | Status: DC

## 2015-08-27 MED ORDER — OMEPRAZOLE 40 MG PO CPDR: 40.0000 mg | DELAYED_RELEASE_CAPSULE | Freq: Every day | ORAL | Status: AC

## 2015-08-27 MED ORDER — GLUCOSE BLOOD VI STRP: ORAL_STRIP | Status: AC

## 2015-08-27 MED ORDER — ACCU-CHEK AVIVA PLUS W/DEVICE KIT: 1.0000 | PACK | Freq: Three times a day (TID) | Status: AC

## 2015-08-27 MED ORDER — GLUCOSE BLOOD VI STRP: ORAL_STRIP | Status: DC

## 2015-08-27 MED ORDER — ACCU-CHEK SOFTCLIX LANCET DEV MISC: Status: AC

## 2015-08-27 MED FILL — ACCU-CHEK AVIVA PLUS METER: W/DEVICE | 1 days supply | Qty: 1 | Fill #0

## 2015-08-27 MED FILL — ACCU-CHEK AVIVA PLUS TEST S: 30 days supply | Qty: 100 | Fill #0 | Status: TO

## 2015-08-27 MED FILL — ACCU-CHEK SOFTCLIX LANCETS: 30 days supply | Qty: 100 | Fill #0 | Status: TO

## 2015-08-27 NOTE — Patient Instructions (Addendum)
Work on inhaler technique:  relax and gently blow all the way out then take a nice smooth deep breath back in, triggering the inhaler at same time you start breathing in.  Hold for up to 5 seconds if you can. Blow out thru nose. Rinse and gargle with water when done.  Plan A = Automatic = Dulera 200 Take 2 puffs first thing in am and then another 2 puffs about 12 hours later.   Plan B = Backup -  Only use your albuterol inhaler  as a rescue medication to be used if you can't catch your breath by resting or doing a relaxed purse lip breathing pattern.  - The less you use it, the better it will work when you need it. - Ok to use up to 2 puffs  every 4 hours if you must but call for immediate appointment if use goes up over your usual need - Don't leave home without it !!  (think of it like the spare tire for your car)  Plan C = Crisis - albuterol 2.5mg  every 4 hours if you try the inhaler first and it doesn't work  Omeprazole 40 mg Take 30-60 min before first meal of the day   GERD (REFLUX)  is an extremely common cause of respiratory symptoms just like yours , many times with no obvious heartburn at all.    It can be treated with medication, but also with lifestyle changes including elevation of the head of your bed (ideally with 6 inch  bed blocks),  Smoking cessation, avoidance of late meals, excessive alcohol, and avoid fatty foods, chocolate, peppermint, colas, red wine, and acidic juices such as orange juice.  NO MINT OR MENTHOL PRODUCTS SO NO COUGH DROPS  USE SUGARLESS CANDY INSTEAD (Jolley ranchers or Stover's or Life Savers) or even ice chips will also do - the key is to swallow to prevent all throat clearing.   NO OIL BASED VITAMINS - use powdered substitutes.  The key is to stop smoking completely before smoking completely stops you!    If you are satisfied with your treatment plan,  let your doctor know and he/she can either refill your medications or you can return here when your  prescription runs out.     If in any way you are not 100% satisfied,  please tell us.  If 100% better, tell your friends!  Pulmonary follow up is as needed

## 2015-08-27 NOTE — Telephone Encounter (Signed)
PT CONFIRM APPT FOR 2/14 AT 10AM. MAILED [place]

## 2015-08-27 NOTE — Progress Notes (Signed)
Subjective:    Patient ID: Janet Gomez, female    DOB: 10/28/1975,    MRN: [id number]    Brief patient profile:  40 yowf active smoker with dx of asthma as 3.5 y old always on meds last had good control x sev months in  2015 but since then freq flares despite maint rx with advair referred 07/01/2015 to pulmonary clinic by ED    History of Present Illness  07/01/2015 1st [facility] office visit/ Cheralyn Oliver   Chief Complaint  Patient presents with  . Pumonary Consult    Self referral. Pt states she was dxed with Asthma at age 40. She states she has had to go to ER x 2 since Oct 2016. She states her symptoms are currently under control.  She uses albuterol 2 x daily on average.   doe x 50 ft  Wakes up most nights with dry cough / wheeze  Using saba multiple forms to control symptoms  rec Start dulera 100 and work on technique    Ov 08/13/15 ov / Elsworth Soho Increase  DUlera 200-5  -take 2 puffs twice daily Prednisone 10 mg tabs - Take 4 tabs  daily with food x 4 days, then 3 tabs daily x 4 days, then 2 tabs daily x 4 days, then 1 tab daily x4 days then stop. #40 Start singulair 10 mg at bedtime daily> never started  Get back on Zyrtec daily    08/27/2015  f/u ov/Coltyn Hanning re: ? Asthma/ actively smoking  Chief Complaint  Patient presents with  . Follow-up    Breathing has improved slightly. She has not had much cough or wheeze.  She is using rescue inhaler 9-10 x per day, and has not used neb.   sleeps fine s am cough/ congestion/ wheeze, all fine until starts moving Doing better on dulera 200 and pred taper but still way overusing saba hfa as noted   No obvious day to day or daytime variability or assoc excess/ purulent sputum or mucus plugs or hemoptysis or cp or chest tightness, subjective wheeze or overt sinus or hb symptoms. No unusual exp hx or h/o childhood pna/ asthma or knowledge of premature birth.  Sleeping ok without nocturnal  or early am exacerbation  of respiratory  c/o's or  need for noct saba. Also denies any obvious fluctuation of symptoms with weather or environmental changes or other aggravating or alleviating factors except as outlined above   Current Medications, Allergies, Complete Past Medical History, Past Surgical History, Family History, and Social History were reviewed in [employer] record.  ROS  The following are not active complaints unless bolded sore throat, dysphagia, dental problems, itching, sneezing,  nasal congestion or excess/ purulent secretions, ear ache,   fever, chills, sweats, unintended wt loss, classically pleuritic or exertional cp,  orthopnea pnd or leg swelling, presyncope, palpitations, abdominal pain, anorexia, nausea, vomiting, diarrhea  or change in bowel or bladder habits, change in stools or urine, dysuria,hematuria,  rash, arthralgias, visual complaints, headache, numbness, weakness or ataxia or problems with walking or coordination,  change in mood/affect or memory.           Objective:   Physical Exam   amb obese wf nad   08/27/2015        213   07/01/15 212 lb 9.6 oz (96.435 kg)  06/17/15 206 lb (93.441 kg)    Vital signs reviewed  HEENT: nl dentition, turbinates, and oropharynx. Nl external ear canals without cough reflex  NECK :  without JVD/Nodes/TM/ nl carotid upstrokes bilaterally   LUNGS: no acc muscle use,  Nl contour chest which is clear to A and P bilaterally without cough on insp or exp maneuvers   CV:  RRR  no s3 or murmur or increase in P2, no edema   ABD:  soft and nontender with nl inspiratory excursion in the supine position. No bruits or organomegaly, bowel sounds nl  MS:  Nl gait/ ext warm without deformities, calf tenderness, cyanosis or clubbing No obvious joint restrictions   SKIN: warm and dry without lesions    NEURO:  alert, approp, nl sensorium with  no motor deficits      I personally reviewed images and agree with radiology impression as follows:  CXR:   06/03/15 No abnormality noted.         Assessment & Plan:

## 2015-08-27 NOTE — Assessment & Plan Note (Signed)

## 2015-08-31 ENCOUNTER — Encounter: Payer: Self-pay | Admitting: Internal Medicine

## 2015-08-31 NOTE — Assessment & Plan Note (Addendum)
08/27/2015  Walked RA x 3 laps @ 185 ft each stopped due to End of study, nl pace, no   desat   Mild sob   Symptoms are markedly disproportionate to objective findings and not clear this is a lung problem but pt does appear to have difficult airway management issues. DDX of  difficult airways management almost all start with A and  include Adherence, Ace Inhibitors, Acid Reflux, Active Sinus Disease, Alpha 1 Antitripsin deficiency, Anxiety masquerading as Airways dz,  ABPA,  Allergy(esp in young), Aspiration (esp in elderly), Adverse effects of meds,  Active smokers, A bunch of PE's (a small clot burden can't cause this syndrome unless there is already severe underlying pulm or vascular dz with poor reserve) plus two Bs  = Bronchiectasis and Beta blocker use..and one C= CHF   In this case Adherence is the biggest issue and starts with  inability to use HFA effectively and also  understand that SABA treats the symptoms but doesn't get to the underlying problem (inflammation).  I used  the analogy of putting steroid cream on a rash to help explain the meaning of topical therapy and the need to get the drug to the target tissue.  See asthma a/p  Active smoking (see separate a/p)   ? Acid (or non-acid) GERD > always difficult to exclude as up to 75% of pts in some series report no assoc GI/ Heartburn symptoms> rec max (24h)  acid suppression and diet restrictions/ reviewed and instructions given in writing.   See avs

## 2015-08-31 NOTE — Assessment & Plan Note (Addendum)
07/01/2015 NO = 6 / active smoker  - 07/01/2015  extensive coaching HFA effectiveness =    75% from a baseline of 50%  - 08/27/2015  extensive coaching HFA effectiveness =    90%  - Spirometry 08/27/2015  FEV1 2.42 (76%)  Ratio 68   Asthma component is actually much better but still sob disproportionate to obj findings - see dyspnea  Still way over using saba/ discussed this and cig smoking (see separate a/p)   No change in rx needed = dulera 200 2bid   I had an extended discussion with the patient reviewing all relevant studies completed to date and  lasting 15 to 20 minutes of a 25 minute visit    Each maintenance medication was reviewed in detail including most importantly the difference between maintenance and prns and under what circumstances the prns are to be triggered using an action plan format that is not reflected in the computer generated alphabetically organized AVS.    Please see instructions for details which were reviewed in writing and the patient given a copy highlighting the part that I personally wrote and discussed at today's ov.

## 2015-08-31 NOTE — Assessment & Plan Note (Signed)
We did not do a post bronchodilator study but the question is moot for now as does not change my recs

## 2015-09-01 NOTE — Telephone Encounter (Addendum)
CMA called patient, patient verified name and DOB. Patient inquiring about denial from [facility] for meloxicam. CMA informed pt that a prior authorization will be sent, or a change of medication that's doesn't need prior auth. Patient verbalized she understood with no further questions.

## 2015-09-02 ENCOUNTER — Telehealth: Payer: Self-pay

## 2015-09-02 NOTE — Telephone Encounter (Signed)
CMA called patient, patient verified name and DOB. CMA contacted [place] to verfiy that patient meloxicam is a preferred med through [employer]. Patient was notified of the approval.

## 2015-09-07 ENCOUNTER — Telehealth: Payer: Self-pay | Admitting: Family Medicine

## 2015-09-07 ENCOUNTER — Telehealth: Payer: Self-pay | Admitting: Genetic Counselor

## 2015-09-07 NOTE — Telephone Encounter (Signed)
Spoke with referring office to gain clarity on reason for referral.  The referring md would like pt to see on of the breast md so the genetic counseling appt was cancelled.  Lt mess for pt regarding her appt being cancelled and informed pt to contact office for new appt.

## 2015-09-07 NOTE — Telephone Encounter (Signed)
error 

## 2015-09-08 ENCOUNTER — Other Ambulatory Visit: Payer: Medicaid Other

## 2015-09-08 ENCOUNTER — Encounter: Payer: Medicaid Other | Admitting: Genetic Counselor

## 2015-09-09 ENCOUNTER — Ambulatory Visit: Payer: Medicaid Other | Admitting: Family Medicine

## 2015-09-14 ENCOUNTER — Other Ambulatory Visit: Payer: Self-pay | Admitting: *Deleted

## 2015-09-14 ENCOUNTER — Ambulatory Visit: Payer: Medicaid Other | Admitting: Internal Medicine

## 2015-09-15 ENCOUNTER — Other Ambulatory Visit: Payer: Medicaid Other

## 2015-09-15 ENCOUNTER — Ambulatory Visit: Payer: Medicaid Other | Admitting: Oncology

## 2015-09-15 NOTE — Progress Notes (Signed)
No show

## 2015-11-03 ENCOUNTER — Telehealth: Payer: Self-pay | Admitting: Clinical

## 2015-11-03 NOTE — Telephone Encounter (Signed)
Attempt termination with patient; no voicmail set up, no message sent.

## 2016-01-14 ENCOUNTER — Other Ambulatory Visit: Payer: Self-pay | Admitting: Oncology
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
## 2024-04-05 ENCOUNTER — Other Ambulatory Visit: Payer: Self-pay | Admitting: Medical Genetics
